# Patient Record
Sex: Female | Born: 1950 | Race: Black or African American | Hispanic: No | Marital: Single | Smoking: Current every day smoker
Health system: Southern US, Community
[De-identification: ages and names within clinical notes are randomized; demographics above are authoritative.]

## PROBLEM LIST (undated history)

## (undated) DIAGNOSIS — T7840XA Allergy, unspecified, initial encounter: Secondary | ICD-10-CM

## (undated) DIAGNOSIS — I639 Cerebral infarction, unspecified: Secondary | ICD-10-CM

## (undated) DIAGNOSIS — M797 Fibromyalgia: Secondary | ICD-10-CM

## (undated) DIAGNOSIS — M810 Age-related osteoporosis without current pathological fracture: Secondary | ICD-10-CM

## (undated) DIAGNOSIS — G4730 Sleep apnea, unspecified: Secondary | ICD-10-CM

## (undated) DIAGNOSIS — F1910 Other psychoactive substance abuse, uncomplicated: Secondary | ICD-10-CM

## (undated) DIAGNOSIS — Z8709 Personal history of other diseases of the respiratory system: Secondary | ICD-10-CM

## (undated) DIAGNOSIS — K219 Gastro-esophageal reflux disease without esophagitis: Secondary | ICD-10-CM

## (undated) DIAGNOSIS — G709 Myoneural disorder, unspecified: Secondary | ICD-10-CM

## (undated) DIAGNOSIS — M1990 Unspecified osteoarthritis, unspecified site: Secondary | ICD-10-CM

## (undated) DIAGNOSIS — F419 Anxiety disorder, unspecified: Secondary | ICD-10-CM

## (undated) DIAGNOSIS — R569 Unspecified convulsions: Secondary | ICD-10-CM

## (undated) DIAGNOSIS — I10 Essential (primary) hypertension: Secondary | ICD-10-CM

## (undated) DIAGNOSIS — F329 Major depressive disorder, single episode, unspecified: Secondary | ICD-10-CM

## (undated) DIAGNOSIS — F32A Depression, unspecified: Secondary | ICD-10-CM

## (undated) HISTORY — DX: Personal history of other diseases of the respiratory system: Z87.09

## (undated) HISTORY — PX: COLON SURGERY: SHX602

## (undated) HISTORY — PX: WISDOM TOOTH EXTRACTION: SHX21

## (undated) HISTORY — DX: Unspecified osteoarthritis, unspecified site: M19.90

## (undated) HISTORY — DX: Gastro-esophageal reflux disease without esophagitis: K21.9

## (undated) HISTORY — PX: CHOLECYSTECTOMY: SHX55

## (undated) HISTORY — DX: Myoneural disorder, unspecified: G70.9

## (undated) HISTORY — DX: Depression, unspecified: F32.A

## (undated) HISTORY — PX: BREAST LUMPECTOMY: SHX2

## (undated) HISTORY — DX: Anxiety disorder, unspecified: F41.9

## (undated) HISTORY — DX: Unspecified convulsions: R56.9

## (undated) HISTORY — PX: TONSILLECTOMY: SUR1361

## (undated) HISTORY — DX: Allergy, unspecified, initial encounter: T78.40XA

## (undated) HISTORY — DX: Sleep apnea, unspecified: G47.30

## (undated) HISTORY — DX: Cerebral infarction, unspecified: I63.9

## (undated) HISTORY — DX: Other psychoactive substance abuse, uncomplicated: F19.10

## (undated) HISTORY — DX: Major depressive disorder, single episode, unspecified: F32.9

---

## 2010-01-10 ENCOUNTER — Emergency Department (HOSPITAL_COMMUNITY)
Admission: EM | Admit: 2010-01-10 | Discharge: 2010-01-11 | Payer: Self-pay | Source: Home / Self Care | Admitting: Emergency Medicine

## 2010-01-10 DIAGNOSIS — F19921 Other psychoactive substance use, unspecified with intoxication with delirium: Secondary | ICD-10-CM

## 2010-03-20 LAB — CBC
MCV: 88.6 fL (ref 78.0–100.0)
Platelets: 402 10*3/uL — ABNORMAL HIGH (ref 150–400)
RDW: 13.4 % (ref 11.5–15.5)
WBC: 13.6 10*3/uL — ABNORMAL HIGH (ref 4.0–10.5)

## 2010-03-20 LAB — RAPID URINE DRUG SCREEN, HOSP PERFORMED: Barbiturates: NOT DETECTED

## 2010-03-20 LAB — URINALYSIS, ROUTINE W REFLEX MICROSCOPIC
Glucose, UA: NEGATIVE mg/dL
Specific Gravity, Urine: 1.024 (ref 1.005–1.030)
pH: 6.5 (ref 5.0–8.0)

## 2010-03-20 LAB — COMPREHENSIVE METABOLIC PANEL
Albumin: 4 g/dL (ref 3.5–5.2)
Alkaline Phosphatase: 100 U/L (ref 39–117)
BUN: 13 mg/dL (ref 6–23)
Calcium: 9.8 mg/dL (ref 8.4–10.5)
Potassium: 3.2 mEq/L — ABNORMAL LOW (ref 3.5–5.1)
Total Protein: 7.7 g/dL (ref 6.0–8.3)

## 2010-03-20 LAB — ETHANOL: Alcohol, Ethyl (B): <5 mg/dL (ref 0–10)

## 2010-03-20 LAB — DIFFERENTIAL
Lymphocytes Relative: 9 % — ABNORMAL LOW (ref 12–46)
Lymphs Abs: 1.3 10*3/uL (ref 0.7–4.0)
Monocytes Absolute: 0.9 10*3/uL (ref 0.1–1.0)
Monocytes Relative: 7 % (ref 3–12)
Neutro Abs: 11.4 10*3/uL — ABNORMAL HIGH (ref 1.7–7.7)

## 2010-03-20 LAB — URINE MICROSCOPIC-ADD ON

## 2013-01-08 DIAGNOSIS — I639 Cerebral infarction, unspecified: Secondary | ICD-10-CM

## 2013-01-08 HISTORY — DX: Cerebral infarction, unspecified: I63.9

## 2013-04-04 ENCOUNTER — Emergency Department (INDEPENDENT_AMBULATORY_CARE_PROVIDER_SITE_OTHER)
Admission: EM | Admit: 2013-04-04 | Discharge: 2013-04-04 | Disposition: A | Discharge status: Home / Self Care | Payer: Medicare Other | Source: Home / Self Care | Attending: Emergency Medicine | Admitting: Emergency Medicine

## 2013-04-04 ENCOUNTER — Encounter (HOSPITAL_COMMUNITY): Payer: Self-pay | Admitting: Emergency Medicine

## 2013-04-04 DIAGNOSIS — S161XXA Strain of muscle, fascia and tendon at neck level, initial encounter: Secondary | ICD-10-CM

## 2013-04-04 DIAGNOSIS — S139XXA Sprain of joints and ligaments of unspecified parts of neck, initial encounter: Secondary | ICD-10-CM

## 2013-04-04 DIAGNOSIS — S39012A Strain of muscle, fascia and tendon of lower back, initial encounter: Secondary | ICD-10-CM

## 2013-04-04 HISTORY — DX: Essential (primary) hypertension: I10

## 2013-04-04 HISTORY — DX: Age-related osteoporosis without current pathological fracture: M81.0

## 2013-04-04 HISTORY — DX: Fibromyalgia: M79.7

## 2013-04-04 MED ORDER — CYCLOBENZAPRINE HCL 5 MG PO TABS: 5.0000 mg | ORAL_TABLET | Freq: Three times a day (TID) | ORAL | Status: DC | PRN

## 2013-04-04 MED ORDER — MELOXICAM 15 MG PO TABS: 15.0000 mg | ORAL_TABLET | Freq: Every day | ORAL | Status: DC

## 2013-04-04 NOTE — Discharge Instructions (Signed)
Do exercises twice daily followed by moist heat for 15 minutes. ° ° ° ° ° °Try to be as active as possible. ° °If no better in 2 weeks, follow up with orthopedist. ° ° °TREATMENT  °Treatment initially involves the use of ice and medication to help reduce pain and inflammation. It is also important to perform strengthening and stretching exercises and modify activities that worsen symptoms so the injury does not get worse. These exercises may be performed at home or with a therapist. For patients who experience severe symptoms, a soft padded collar may be recommended to be worn around the neck.  °Improving your posture may help reduce symptoms. Posture improvement includes pulling your chin and abdomen in while sitting or standing. If you are sitting, sit in a firm chair with your buttocks against the back of the chair. While sleeping, try replacing your pillow with a small towel rolled to 2 inches in diameter, or use a cervical pillow. Poor sleeping positions delay healing.  ° °MEDICATION  °· If pain medication is necessary, nonsteroidal anti-inflammatory medications, such as aspirin and ibuprofen, or other minor pain relievers, such as acetaminophen, are often recommended. °· Do not take pain medication for 7 days before surgery. °· Prescription pain relievers may be given if deemed necessary by your caregiver. Use only as directed and only as much as you need. ° °HEAT AND COLD:  °· Cold treatment (icing) relieves pain and reduces inflammation. Cold treatment should be applied for 10 to 15 minutes every 2 to 3 hours for inflammation and pain and immediately after any activity that aggravates your symptoms. Use ice packs or an ice massage. °· Heat treatment may be used prior to performing the stretching and strengthening activities prescribed by your caregiver, physical therapist, or athletic trainer. Use a heat pack or a warm soak. ° °SEEK MEDICAL CARE IF:  °· Symptoms get worse or do not improve in 2 weeks despite  treatment. °· New, unexplained symptoms develop (drugs used in treatment may produce side effects). ° °EXERCISES °RANGE OF MOTION (ROM) AND STRETCHING EXERCISES - Cervical Strain and Sprain °These exercises may help you when beginning to rehabilitate your injury. In order to successfully resolve your symptoms, you must improve your posture. These exercises are designed to help reduce the forward-head and rounded-shoulder posture which contributes to this condition. Your symptoms may resolve with or without further involvement from your physician, physical therapist or athletic trainer. While completing these exercises, remember:  °· Restoring tissue flexibility helps normal motion to return to the joints. This allows healthier, less painful movement and activity. °· An effective stretch should be held for at least 20 seconds, although you may need to begin with shorter hold times for comfort. °· A stretch should never be painful. You should only feel a gentle lengthening or release in the stretched tissue. ° °STRETCH- Axial Extensors °· Lie on your back on the floor. You may bend your knees for comfort. Place a rolled up hand towel or dish towel, about 2 inches in diameter, under the part of your head that makes contact with the floor. °· Gently tuck your chin, as if trying to make a "double chin," until you feel a gentle stretch at the base of your head. °· Hold _____10_____ seconds. °Repeat _____10_____ times. Complete this exercise _____2_____ times per day.  ° °STRETECH - Axial Extension  °· Stand or sit on a firm surface. Assume a good posture: chest up, shoulders drawn back, abdominal muscles slightly   tense, knees unlocked (if standing) and feet hip width apart. °· Slowly retract your chin so your head slides back and your chin slightly lowers.Continue to look straight ahead. °· You should feel a gentle stretch in the back of your head. Be certain not to feel an aggressive stretch since this can cause  headaches later. °· Hold for ____10______ seconds. °Repeat _____10_____ times. Complete this exercise ____2______ times per day. ° °STRETCH  Cervical Side Bend  °· Stand or sit on a firm surface. Assume a good posture: chest up, shoulders drawn back, abdominal muscles slightly tense, knees unlocked (if standing) and feet hip width apart. °· Without letting your nose or shoulders move, slowly tip your right / left ear to your shoulder until your feel a gentle stretch in the muscles on the opposite side of your neck. °· Hold _____10_____ seconds. °Repeat _____10_____ times. Complete this exercise _____2_____ times per day. ° °STRETCH  Cervical Rotators  °· Stand or sit on a firm surface. Assume a good posture: chest up, shoulders drawn back, abdominal muscles slightly tense, knees unlocked (if standing) and feet hip width apart. °· Keeping your eyes level with the ground, slowly turn your head until you feel a gentle stretch along the back and opposite side of your neck. °· Hold _____10_____ seconds. °Repeat ____10______ times. Complete this exercise ____2______ times per day. ° °RANGE OF MOTION - Neck Circles  °· Stand or sit on a firm surface. Assume a good posture: chest up, shoulders drawn back, abdominal muscles slightly tense, knees unlocked (if standing) and feet hip width apart. °· Gently roll your head down and around from the back of one shoulder to the back of the other. The motion should never be forced or painful. °· Repeat the motion 10-20 times, or until you feel the neck muscles relax and loosen. °Repeat ____10______ times. Complete the exercise _____2_____ times per day. ° °STRENGTHENING EXERCISES - Cervical Strain and Sprain °These exercises may help you when beginning to rehabilitate your injury. They may resolve your symptoms with or without further involvement from your physician, physical therapist or athletic trainer. While completing these exercises, remember:  °· Muscles can gain both the  endurance and the strength needed for everyday activities through controlled exercises. °· Complete these exercises as instructed by your physician, physical therapist or athletic trainer. Progress the resistance and repetitions only as guided. °· You may experience muscle soreness or fatigue, but the pain or discomfort you are trying to eliminate should never worsen during these exercises. If this pain does worsen, stop and make certain you are following the directions exactly. If the pain is still present after adjustments, discontinue the exercise until you can discuss the trouble with your clinician. ° °STRENGTH Cervical Flexors, Isometric °· Face a wall, standing about 6 inches away. Place a small pillow, a ball about 6-8 inches in diameter, or a folded towel between your forehead and the wall. °· Slightly tuck your chin and gently push your forehead into the soft object. Push only with mild to moderate intensity, building up tension gradually. Keep your jaw and forehead relaxed. °· Hold 10 to 20 seconds. Keep your breathing relaxed. °· Release the tension slowly. Relax your neck muscles completely before you start the next repetition. °Repeat _____10_____ times. Complete this exercise _____2_____ times per day. ° °STRENGTH- Cervical Lateral Flexors, Isometric  °· Stand about 6 inches away from a wall. Place a small pillow, a ball about 6-8 inches in diameter, or a folded   towel between the side of your head and the wall. °· Slightly tuck your chin and gently tilt your head into the soft object. Push only with mild to moderate intensity, building up tension gradually. Keep your jaw and forehead relaxed. °· Hold 10 to 20 seconds. Keep your breathing relaxed. °· Release the tension slowly. Relax your neck muscles completely before you start the next repetition. °Repeat _____10_____ times. Complete this exercise ____2______ times per day. ° °STRENGTH  Cervical Extensors, Isometric  °· Stand about 6 inches away from  a wall. Place a small pillow, a ball about 6-8 inches in diameter, or a folded towel between the back of your head and the wall. °· Slightly tuck your chin and gently tilt your head back into the soft object. Push only with mild to moderate intensity, building up tension gradually. Keep your jaw and forehead relaxed. °· Hold 10 to 20 seconds. Keep your breathing relaxed. °· Release the tension slowly. Relax your neck muscles completely before you start the next repetition. °Repeat _____10_____ times. Complete this exercise _____2_____ times per day. ° °POSTURE AND BODY MECHANICS CONSIDERATIONS - Cervical Strain and Sprain °Keeping correct posture when sitting, standing or completing your activities will reduce the stress put on different body tissues, allowing injured tissues a chance to heal and limiting painful experiences. The following are general guidelines for improved posture. Your physician or physical therapist will provide you with any instructions specific to your needs. While reading these guidelines, remember: °· The exercises prescribed by your provider will help you have the flexibility and strength to maintain correct postures. °· The correct posture provides the optimal environment for your joints to work. All of your joints have less wear and tear when properly supported by a spine with good posture. This means you will experience a healthier, less painful body. °· Correct posture must be practiced with all of your activities, especially prolonged sitting and standing. Correct posture is as important when doing repetitive low-stress activities (typing) as it is when doing a single heavy-load activity (lifting). °PROLONGED STANDING WHILE SLIGHTLY LEANING FORWARD °When completing a task that requires you to lean forward while standing in one place for a long time, place either foot up on a stationary 2-4 inch high object to help maintain the best posture. When both feet are on the ground, the low  back tends to lose its slight inward curve. If this curve flattens (or becomes too large), then the back and your other joints will experience too much stress, fatigue more quickly and can cause pain.  °RESTING POSITIONS °Consider which positions are most painful for you when choosing a resting position. If you have pain with flexion-based activities (sitting, bending, stooping, squatting), choose a position that allows you to rest in a less flexed posture. You would want to avoid curling into a fetal position on your side. If your pain worsens with extension-based activities (prolonged standing, working overhead), avoid resting in an extended position such as sleeping on your stomach. Most people will find more comfort when they rest with their spine in a more neutral position, neither too rounded nor too arched. Lying on a non-sagging bed on your side with a pillow between your knees, or on your back with a pillow under your knees will often provide some relief. Keep in mind, being in any one position for a prolonged period of time, no matter how correct your posture, can still lead to stiffness. °WALKING °Walk with an upright posture. Your ears,   shoulders and hips should all line-up. °OFFICE WORK °When working at a desk, create an environment that supports good, upright posture. Without extra support, muscles fatigue and lead to excessive strain on joints and other tissues. °CHAIR: °· A chair should be able to slide under your desk when your back makes contact with the back of the chair. This allows you to work closely. °· The chair's height should allow your eyes to be level with the upper part of your monitor and your hands to be slightly lower than your elbows. °· Body position: °· Your feet should make contact with the floor. If this is not possible, use a foot rest. °· Keep your ears over your shoulders. This will reduce stress on your neck and low back. °Document Released: 12/25/2004 Document Revised:  03/19/2011 Document Reviewed: 04/08/2008 °[employer]® Patient Information ©2013 [employer]. ° ° °
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
## 2013-07-21 ENCOUNTER — Ambulatory Visit: Payer: Medicare Other | Admitting: Diagnostic Neuroimaging

## 2013-08-03 ENCOUNTER — Ambulatory Visit (INDEPENDENT_AMBULATORY_CARE_PROVIDER_SITE_OTHER): Payer: BC Managed Care – PPO | Admitting: Diagnostic Neuroimaging

## 2013-08-03 ENCOUNTER — Encounter: Payer: Self-pay | Admitting: Diagnostic Neuroimaging

## 2013-08-03 VITALS — BP 116/76 | HR 114 | Temp 98.7°F | Ht 62.0 in | Wt 171.0 lb

## 2013-08-03 DIAGNOSIS — R259 Unspecified abnormal involuntary movements: Secondary | ICD-10-CM

## 2013-08-03 DIAGNOSIS — R251 Tremor, unspecified: Secondary | ICD-10-CM

## 2013-08-03 NOTE — Progress Notes (Signed)
[facility]  PATIENT: [name] [name] DOB: Mar 29, 1950  REFERRING CLINICIAN: Kpeglio HISTORY FROM: patient  REASON FOR VISIT: new consult   HISTORICAL  CHIEF COMPLAINT:  Chief Complaint  Patient presents with  . Tremors    Extremeties - started in hands; in legs now also.    HISTORY OF PRESENT ILLNESS:   63 year old right-handed female here for evaluation of tremor.  For past 5 years patient has had gradual onset, progressive, intermittent tremor in her hands. Patient notices tremor when her hands are hurting from fibromyalgia or when she is under increased stress, anxiety, panic sensation. Patient has been on Risperdal for 4 years. Patient reports being on some medications to help with tremor control in the past but doesn't remember the names. Patient sees a PCP as well as psychiatrist.  No gait or balance difficulty. She has had some weight gain and fatigue. Some memory loss, headache, weakness, insomnia, snoring, restless legs. Patient reports remote history of seizure.  REVIEW OF SYSTEMS: Full 14 system review of systems performed and notable only for only as per history of present illness.  ALLERGIES: Allergies  Allergen Reactions  . Tetracyclines & Related     HOME MEDICATIONS: Outpatient Prescriptions Prior to Visit  Medication Sig Dispense Refill  . ALPRAZolam (XANAX) 0.5 MG tablet Take 0.5 mg by mouth at bedtime as needed for anxiety.      Marland Kitchen CITALOPRAM HYDROBROMIDE PO Take by mouth.      . cyclobenzaprine (FLEXERIL) 5 MG tablet Take 1 tablet (5 mg total) by mouth 3 (three) times daily as needed for muscle spasms.  30 tablet  0  . meloxicam (MOBIC) 15 MG tablet Take 1 tablet (15 mg total) by mouth daily.  15 tablet  0  . oxyCODONE (ROXICODONE) 15 MG immediate release tablet Take 15 mg by mouth every 4 (four) hours as needed for pain.      Marland Kitchen risperiDONE (RISPERDAL) 2 MG tablet Take 2 mg by mouth at bedtime.       No facility-administered  medications prior to visit.    PAST MEDICAL HISTORY: Past Medical History  Diagnosis Date  . Hypertension   . Fibromyalgia   . Osteoporosis     PAST SURGICAL HISTORY: Past Surgical History  Procedure Laterality Date  . Cholecystectomy      FAMILY HISTORY: Family History  Problem Relation Age of Onset  . Stroke Father   . Alzheimer's disease Mother     SOCIAL HISTORY:  History   Social History  . Marital Status: Single    Spouse Name: N/A    Number of Children: N/A  . Years of Education: N/A   Occupational History  . Not on file.   Social History Main Topics  . Smoking status: Current Every Day Smoker -- 0.50 packs/day    Types: Cigarettes  . Smokeless tobacco: Not on file  . Alcohol Use: Yes  . Drug Use: No  . Sexual Activity: Not on file   Other Topics Concern  . Not on file   Social History Narrative  . No narrative on file     PHYSICAL EXAM  Filed Vitals:   08/03/13 1019  BP: 116/76  Pulse: 114  Temp: 98.7 F (37.1 C)  TempSrc: Oral  Height: '5\' 2"'  (1.575 m)  Weight: 171 lb (77.565 kg)    Not recorded    Body mass index is 31.27 kg/(m^2).  GENERAL EXAM: Patient is in no distress; well developed, nourished and groomed; neck is  supple  CARDIOVASCULAR: Regular rate and rhythm, no murmurs, no carotid bruits  NEUROLOGIC: MENTAL STATUS: awake, alert, oriented to person, place and time, recent and remote memory intact, normal attention and concentration, language fluent, comprehension intact, naming intact, fund of knowledge appropriate; MASKED FACIES. NEG SNOUT. POSITIVE MYERSONS. CRANIAL NERVE: no papilledema on fundoscopic exam, pupils equal and reactive to light, visual fields full to confrontation, extraocular muscles intact, no nystagmus, facial sensation and strength symmetric, hearing intact, palate elevates symmetrically, uvula midline, shoulder shrug symmetric, tongue midline. MOTOR: normal bulk and tone, MINIMAL POSTURAL TREMOR IN  HANDS. NO REST TREMOR. NO ACTION TREMOR. NO RIGIDITY OR BRADYKINESIA. SMOOTH ARCHIMEDES SPIRALS. Full strength in the BUE, BLE SENSORY: normal and symmetric to light touch, pinprick, temperature, vibration COORDINATION: finger-nose-finger, fine finger movements normal REFLEXES: deep tendon reflexes present and symmetric GAIT/STATION: narrow based gait; able to walk on toes, heels and tandem; romberg is negative    DIAGNOSTIC DATA (LABS, IMAGING, TESTING) - I reviewed patient records, labs, notes, testing and imaging myself where available.  Lab Results  Component Value Date   WBC 13.6* 01/10/2010   HGB 13.6 01/10/2010   HCT 39.0 01/10/2010   MCV 88.6 01/10/2010   PLT 402* 01/10/2010      Component Value Date/Time   NA 142 01/10/2010 1125   K 3.2* 01/10/2010 1125   CL 104 01/10/2010 1125   CO2 27 01/10/2010 1125   GLUCOSE 128* 01/10/2010 1125   BUN 13 01/10/2010 1125   CREATININE 1.09 01/10/2010 1125   CALCIUM 9.8 01/10/2010 1125   PROT 7.7 01/10/2010 1125   ALBUMIN 4.0 01/10/2010 1125   AST 23 01/10/2010 1125   ALT 11 01/10/2010 1125   ALKPHOS 100 01/10/2010 1125   BILITOT 0.8 01/10/2010 1125   GFRNONAA 51* 01/10/2010 1125   GFRAA  Value: >60        The eGFR has been calculated using the MDRD equation. This calculation has not been validated in all clinical situations. eGFR's persistently <60 mL/min signify possible Chronic Kidney Disease. 01/10/2010 1125   No results found for this basename: CHOL, HDL, LDLCALC, LDLDIRECT, TRIG, CHOLHDL   No results found for this basename: HGBA1C   No results found for this basename: VITAMINB12   No results found for this basename: TSH     ASSESSMENT AND PLAN  63 y.o. year old female here with tremor. Likely enhanced physiologic tremor related to pain and stress/anxiety. No signs of parkinson's disease. Some drug induced parkinsonism is possible with risperidone.   PLAN: - tremor workup - consider lower dose of risperidone; but patient reluctant to try because it is  helping her other symptoms  Orders Placed This Encounter  Procedures  . MR Brain W Wo Contrast  . TSH  . Comprehensive metabolic panel   Return in about 6 months (around 02/03/2014).    Penni Bombard, MD 2/44/6950, 72:25 AM Certified in Neurology, Neurophysiology and Neuroimaging  [facility] [address] [place], [place] [address] [phone]

## 2013-08-04 LAB — COMPREHENSIVE METABOLIC PANEL
ALK PHOS: 127 IU/L — AB (ref 39–117)
ALT: 16 IU/L (ref 0–32)
AST: 17 IU/L (ref 0–40)
Albumin/Globulin Ratio: 1.8 (ref 1.1–2.5)
Albumin: 4.3 g/dL (ref 3.6–4.8)
BUN / CREAT RATIO: 11 (ref 11–26)
BUN: 11 mg/dL (ref 8–27)
CO2: 24 mmol/L (ref 18–29)
CREATININE: 0.97 mg/dL (ref 0.57–1.00)
Calcium: 9.8 mg/dL (ref 8.7–10.3)
Chloride: 90 mmol/L — ABNORMAL LOW (ref 97–108)
GFR calc Af Amer: 72 mL/min/{1.73_m2} (ref >59)
GFR calc non Af Amer: 62 mL/min/{1.73_m2} (ref >59)
GLOBULIN, TOTAL: 2.4 g/dL (ref 1.5–4.5)
Glucose: 105 mg/dL — ABNORMAL HIGH (ref 65–99)
POTASSIUM: 3.7 mmol/L (ref 3.5–5.2)
SODIUM: 136 mmol/L (ref 134–144)
Total Bilirubin: 0.3 mg/dL (ref 0.0–1.2)
Total Protein: 6.7 g/dL (ref 6.0–8.5)

## 2013-08-04 LAB — TSH: TSH: 0.509 u[IU]/mL (ref 0.450–4.500)

## 2013-08-15 ENCOUNTER — Other Ambulatory Visit: Payer: Medicare Other

## 2013-08-18 ENCOUNTER — Inpatient Hospital Stay: Admission: RE | Admit: 2013-08-18 | Payer: Medicare Other | Source: Ambulatory Visit

## 2013-08-19 ENCOUNTER — Observation Stay (HOSPITAL_COMMUNITY)
Admission: EM | Admit: 2013-08-19 | Discharge: 2013-08-22 | Disposition: A | Discharge status: Home / Self Care | Payer: Medicare Other | Attending: Internal Medicine | Admitting: Internal Medicine

## 2013-08-19 ENCOUNTER — Emergency Department (HOSPITAL_COMMUNITY): Payer: Medicare Other

## 2013-08-19 ENCOUNTER — Encounter (HOSPITAL_COMMUNITY): Payer: Self-pay | Admitting: Emergency Medicine

## 2013-08-19 DIAGNOSIS — R569 Unspecified convulsions: Secondary | ICD-10-CM | POA: Diagnosis present

## 2013-08-19 DIAGNOSIS — R130 Aphagia: Secondary | ICD-10-CM

## 2013-08-19 DIAGNOSIS — Z79899 Other long term (current) drug therapy: Secondary | ICD-10-CM | POA: Insufficient documentation

## 2013-08-19 DIAGNOSIS — F329 Major depressive disorder, single episode, unspecified: Secondary | ICD-10-CM | POA: Diagnosis not present

## 2013-08-19 DIAGNOSIS — IMO0001 Reserved for inherently not codable concepts without codable children: Secondary | ICD-10-CM | POA: Diagnosis not present

## 2013-08-19 DIAGNOSIS — I69920 Aphasia following unspecified cerebrovascular disease: Secondary | ICD-10-CM

## 2013-08-19 DIAGNOSIS — G20 Parkinson's disease: Secondary | ICD-10-CM | POA: Insufficient documentation

## 2013-08-19 DIAGNOSIS — I6350 Cerebral infarction due to unspecified occlusion or stenosis of unspecified cerebral artery: Secondary | ICD-10-CM

## 2013-08-19 DIAGNOSIS — M810 Age-related osteoporosis without current pathological fracture: Secondary | ICD-10-CM | POA: Insufficient documentation

## 2013-08-19 DIAGNOSIS — F1210 Cannabis abuse, uncomplicated: Secondary | ICD-10-CM | POA: Diagnosis not present

## 2013-08-19 DIAGNOSIS — F17200 Nicotine dependence, unspecified, uncomplicated: Secondary | ICD-10-CM | POA: Diagnosis not present

## 2013-08-19 DIAGNOSIS — F411 Generalized anxiety disorder: Secondary | ICD-10-CM | POA: Diagnosis not present

## 2013-08-19 DIAGNOSIS — IMO0002 Reserved for concepts with insufficient information to code with codable children: Secondary | ICD-10-CM

## 2013-08-19 DIAGNOSIS — R4182 Altered mental status, unspecified: Secondary | ICD-10-CM

## 2013-08-19 DIAGNOSIS — F3289 Other specified depressive episodes: Secondary | ICD-10-CM | POA: Insufficient documentation

## 2013-08-19 DIAGNOSIS — I10 Essential (primary) hypertension: Secondary | ICD-10-CM | POA: Diagnosis not present

## 2013-08-19 DIAGNOSIS — R9431 Abnormal electrocardiogram [ECG] [EKG]: Secondary | ICD-10-CM | POA: Diagnosis not present

## 2013-08-19 DIAGNOSIS — I6389 Other cerebral infarction: Secondary | ICD-10-CM

## 2013-08-19 DIAGNOSIS — M797 Fibromyalgia: Secondary | ICD-10-CM | POA: Diagnosis present

## 2013-08-19 DIAGNOSIS — E876 Hypokalemia: Secondary | ICD-10-CM | POA: Insufficient documentation

## 2013-08-19 DIAGNOSIS — R4701 Aphasia: Secondary | ICD-10-CM | POA: Insufficient documentation

## 2013-08-19 DIAGNOSIS — G20A1 Parkinson's disease without dyskinesia, without mention of fluctuations: Secondary | ICD-10-CM | POA: Insufficient documentation

## 2013-08-19 DIAGNOSIS — G9349 Other encephalopathy: Secondary | ICD-10-CM | POA: Diagnosis not present

## 2013-08-19 DIAGNOSIS — F1290 Cannabis use, unspecified, uncomplicated: Secondary | ICD-10-CM | POA: Diagnosis present

## 2013-08-19 LAB — COMPREHENSIVE METABOLIC PANEL
ALK PHOS: 119 U/L — AB (ref 39–117)
ALT: 13 U/L (ref 0–35)
AST: 17 U/L (ref 0–37)
Albumin: 4.2 g/dL (ref 3.5–5.2)
Anion gap: 21 — ABNORMAL HIGH (ref 5–15)
BILIRUBIN TOTAL: 0.5 mg/dL (ref 0.3–1.2)
BUN: 13 mg/dL (ref 6–23)
CHLORIDE: 93 meq/L — AB (ref 96–112)
CO2: 23 meq/L (ref 19–32)
Calcium: 10.2 mg/dL (ref 8.4–10.5)
Creatinine, Ser: 0.99 mg/dL (ref 0.50–1.10)
GFR calc non Af Amer: 59 mL/min — ABNORMAL LOW (ref >90)
GFR, EST AFRICAN AMERICAN: 69 mL/min — AB (ref >90)
GLUCOSE: 142 mg/dL — AB (ref 70–99)
Potassium: 3.4 mEq/L — ABNORMAL LOW (ref 3.7–5.3)
SODIUM: 137 meq/L (ref 137–147)
Total Protein: 8.3 g/dL (ref 6.0–8.3)

## 2013-08-19 LAB — URINALYSIS, ROUTINE W REFLEX MICROSCOPIC
Glucose, UA: NEGATIVE mg/dL
Ketones, ur: >80 mg/dL — AB
Leukocytes, UA: NEGATIVE
Nitrite: NEGATIVE
Protein, ur: 100 mg/dL — AB
Specific Gravity, Urine: 1.021 (ref 1.005–1.030)
Urobilinogen, UA: 1 mg/dL (ref 0.0–1.0)
pH: 6 (ref 5.0–8.0)

## 2013-08-19 LAB — CBG MONITORING, ED: Glucose-Capillary: 147 mg/dL — ABNORMAL HIGH (ref 70–99)

## 2013-08-19 LAB — DIFFERENTIAL
Basophils Absolute: 0 10*3/uL (ref 0.0–0.1)
Basophils Relative: 0 % (ref 0–1)
EOS PCT: 0 % (ref 0–5)
Eosinophils Absolute: 0 10*3/uL (ref 0.0–0.7)
LYMPHS PCT: 15 % (ref 12–46)
Lymphs Abs: 1.5 10*3/uL (ref 0.7–4.0)
Monocytes Absolute: 0.8 10*3/uL (ref 0.1–1.0)
Monocytes Relative: 8 % (ref 3–12)
NEUTROS ABS: 7.7 10*3/uL (ref 1.7–7.7)
Neutrophils Relative %: 77 % (ref 43–77)

## 2013-08-19 LAB — CBC
HCT: 43.6 % (ref 36.0–46.0)
Hemoglobin: 15.8 g/dL — ABNORMAL HIGH (ref 12.0–15.0)
MCH: 29.9 pg (ref 26.0–34.0)
MCHC: 36.2 g/dL — ABNORMAL HIGH (ref 30.0–36.0)
MCV: 82.4 fL (ref 78.0–100.0)
PLATELETS: 406 10*3/uL — AB (ref 150–400)
RBC: 5.29 MIL/uL — AB (ref 3.87–5.11)
RDW: 13.3 % (ref 11.5–15.5)
WBC: 10.1 10*3/uL (ref 4.0–10.5)

## 2013-08-19 LAB — URINE MICROSCOPIC-ADD ON

## 2013-08-19 LAB — RAPID URINE DRUG SCREEN, HOSP PERFORMED
Amphetamines: NOT DETECTED
Barbiturates: NOT DETECTED
Benzodiazepines: NOT DETECTED
Cocaine: NOT DETECTED
Opiates: NOT DETECTED
Tetrahydrocannabinol: POSITIVE — AB

## 2013-08-19 LAB — I-STAT CHEM 8, ED
BUN: 12 mg/dL (ref 6–23)
CHLORIDE: 94 meq/L — AB (ref 96–112)
CREATININE: 1.1 mg/dL (ref 0.50–1.10)
Calcium, Ion: 1.14 mmol/L (ref 1.13–1.30)
Glucose, Bld: 150 mg/dL — ABNORMAL HIGH (ref 70–99)
HCT: 51 % — ABNORMAL HIGH (ref 36.0–46.0)
Hemoglobin: 17.3 g/dL — ABNORMAL HIGH (ref 12.0–15.0)
Potassium: 3.1 mEq/L — ABNORMAL LOW (ref 3.7–5.3)
Sodium: 136 mEq/L — ABNORMAL LOW (ref 137–147)
TCO2: 24 mmol/L (ref 0–100)

## 2013-08-19 LAB — I-STAT TROPONIN, ED: Troponin i, poc: 0 ng/mL (ref 0.00–0.08)

## 2013-08-19 LAB — TROPONIN I: Troponin I: <0.3 ng/mL (ref <0.30)

## 2013-08-19 LAB — CARBOXYHEMOGLOBIN
Carboxyhemoglobin: 1.4 % (ref 0.5–1.5)
METHEMOGLOBIN: 0.8 % (ref 0.0–1.5)
O2 Saturation: 60.9 %
Total hemoglobin: 16.2 g/dL — ABNORMAL HIGH (ref 12.0–16.0)

## 2013-08-19 LAB — PROTIME-INR
INR: 1.05 (ref 0.00–1.49)
PROTHROMBIN TIME: 13.7 s (ref 11.6–15.2)

## 2013-08-19 LAB — APTT: aPTT: 37 seconds (ref 24–37)

## 2013-08-19 MED ORDER — ENOXAPARIN SODIUM 40 MG/0.4ML ~~LOC~~ SOLN
40.0000 mg | SUBCUTANEOUS | Status: DC
  Administered 2013-08-19 – 2013-08-21 (×3): 40 mg via SUBCUTANEOUS
  Filled 2013-08-19 (×3): qty 0.4

## 2013-08-19 MED ORDER — HYDROCHLOROTHIAZIDE 25 MG PO TABS
25.0000 mg | ORAL_TABLET | Freq: Every day | ORAL | Status: DC
  Administered 2013-08-20 – 2013-08-22 (×3): 25 mg via ORAL
  Filled 2013-08-19 (×3): qty 1

## 2013-08-19 MED ORDER — POTASSIUM CHLORIDE 10 MEQ/100ML IV SOLN
10.0000 meq | INTRAVENOUS | Status: AC
  Administered 2013-08-19: 10 meq via INTRAVENOUS
  Filled 2013-08-19 (×2): qty 100

## 2013-08-19 MED ORDER — POTASSIUM CHLORIDE 10 MEQ/100ML IV SOLN
10.0000 meq | Freq: Once | INTRAVENOUS | Status: AC
  Administered 2013-08-19: 10 meq via INTRAVENOUS

## 2013-08-19 MED ORDER — ALPRAZOLAM 0.5 MG PO TABS
0.5000 mg | ORAL_TABLET | Freq: Every day | ORAL | Status: DC
  Administered 2013-08-20 – 2013-08-22 (×3): 0.5 mg via ORAL
  Filled 2013-08-19 (×3): qty 1

## 2013-08-19 MED ORDER — PANTOPRAZOLE SODIUM 40 MG PO TBEC
40.0000 mg | DELAYED_RELEASE_TABLET | Freq: Every day | ORAL | Status: DC
  Administered 2013-08-20 – 2013-08-22 (×3): 40 mg via ORAL
  Filled 2013-08-19 (×3): qty 1

## 2013-08-19 MED ORDER — CITALOPRAM HYDROBROMIDE 10 MG PO TABS
10.0000 mg | ORAL_TABLET | Freq: Every day | ORAL | Status: DC
  Administered 2013-08-20 – 2013-08-22 (×3): 10 mg via ORAL
  Filled 2013-08-19 (×3): qty 1

## 2013-08-19 MED ORDER — RISPERIDONE 0.5 MG PO TABS
2.0000 mg | ORAL_TABLET | Freq: Every day | ORAL | Status: DC
  Administered 2013-08-19 – 2013-08-21 (×3): 2 mg via ORAL
  Filled 2013-08-19 (×3): qty 4

## 2013-08-19 MED ORDER — STROKE: EARLY STAGES OF RECOVERY BOOK
Freq: Once | Status: AC
  Administered 2013-08-19: 21:00:00
  Filled 2013-08-19 (×2): qty 1

## 2013-08-19 MED ORDER — SENNOSIDES-DOCUSATE SODIUM 8.6-50 MG PO TABS: 1.0000 | ORAL_TABLET | Freq: Every evening | ORAL | Status: DC | PRN

## 2013-08-19 NOTE — ED Provider Notes (Signed)
CSN: [id number]     Arrival date & time 08/19/13  72 History   First MD Initiated Contact with Patient 08/19/13 1545     Chief Complaint  Patient presents with  . Altered Mental Status   Level 5 Caveat   (Consider location/radiation/quality/duration/timing/severity/associated sxs/prior Treatment) HPI Pt is a 63yo female with hx of HTH, fibromyalgia, and osteoporosis brought to ED from home via EMS with reports of AMS.  Per triage note from EMS, pt lives alone, a friend picked her up yesterday and noticed she was acting "differently."  The friend returned to check on pt today and noted a smell of gas in the home, pt was holding a tube of toothpaste to her ear and talking about a toothache.  Pt is able to follow commands but is then nonsensical.  Per RN note, when asked where she was, pt responded "in cold bing bong and I stand thing frying thing."  Pt denies pain, is calm and cooperative.   6:06 PM family has arrived and states pt has had similar episodes of AMS in past and believe it may be due to early dementia due to family hx of same. Denies hx of previous strokes.  Pt was scheduled for an MRI of her brain yesterday but did not go due to "not acting right" but AMS worsened today which prompted family to call EMS.  Past Medical History  Diagnosis Date  . Hypertension   . Fibromyalgia   . Osteoporosis    Past Surgical History  Procedure Laterality Date  . Cholecystectomy     Family History  Problem Relation Age of Onset  . Stroke Father   . Alzheimer's disease Mother    History  Substance Use Topics  . Smoking status: Current Every Day Smoker -- 0.50 packs/day    Types: Cigarettes  . Smokeless tobacco: Not on file  . Alcohol Use: Yes   OB History   Grav Para Term Preterm Abortions TAB SAB Ect Mult Living                 Review of Systems  Unable to perform ROS: Mental status change      Allergies  Tetracyclines & related  Home Medications   Prior to Admission  medications   Medication Sig Start Date End Date Taking? Authorizing Provider  ALPRAZolam Duanne Moron) 0.5 MG tablet Take 0.5 mg by mouth daily.    Yes Historical Provider, MD  CITALOPRAM HYDROBROMIDE PO Take by mouth.    Historical Provider, MD  cyclobenzaprine (FLEXERIL) 5 MG tablet Take 1 tablet (5 mg total) by mouth 3 (three) times daily as needed for muscle spasms. 04/04/13   Harden Mo, MD  hydrochlorothiazide (HYDRODIURIL) 25 MG tablet Take 1 tablet by mouth daily. 07/02/13   Historical Provider, MD  hydrOXYzine (ATARAX/VISTARIL) 25 MG tablet Take 1 tablet by mouth daily. 07/09/13   Historical Provider, MD  meloxicam (MOBIC) 15 MG tablet Take 1 tablet (15 mg total) by mouth daily. 04/04/13   Harden Mo, MD  omeprazole (PRILOSEC) 20 MG capsule Take 1 capsule by mouth daily. 07/29/13   Historical Provider, MD  oxyCODONE (ROXICODONE) 15 MG immediate release tablet Take 15 mg by mouth every 4 (four) hours as needed for pain.    Historical Provider, MD  prochlorperazine (COMPAZINE) 10 MG tablet Take 1 tablet by mouth as needed. 07/03/13   Historical Provider, MD  risperiDONE (RISPERDAL) 2 MG tablet Take 2 mg by mouth at bedtime.    Historical  Provider, MD  triamterene-hydrochlorothiazide (MAXZIDE-25) 37.5-25 MG per tablet Take 1 tablet by mouth daily. 07/03/13   Historical Provider, MD   BP 122/70  Pulse 112  Temp(Src) 99.1 F (37.3 C) (Rectal)  Resp 21  SpO2 95% Physical Exam  Nursing note and vitals reviewed. Constitutional: She appears well-developed and well-nourished. No distress.  Elderly female lying in exam bed, awake and alert. Non-toxic appearing, NAD  HENT:  Head: Normocephalic and atraumatic.  Eyes: Conjunctivae are normal. No scleral icterus.  Neck: Normal range of motion.  Cardiovascular: Regular rhythm and normal heart sounds.  Tachycardia present.   Pulmonary/Chest: Effort normal and breath sounds normal. No respiratory distress. She has no wheezes. She has no rales. She  exhibits no tenderness.  No respiratory distress. Decreased lung sounds in lower lung fields.  No wheeze or crackles  Abdominal: Soft. Bowel sounds are normal. She exhibits no distension and no mass. There is no tenderness. There is no rebound and no guarding.  Musculoskeletal: Normal range of motion.  Neurological: She is alert.  Pt awake and alert, able to follow most commands. 4/5 strength in upper and lower extremities bilaterally. occasionally answers "yes" and "no" to questions.  Nonsensical speech at times.  Pt not ambulated due to AMS  Skin: Skin is warm and dry. She is not diaphoretic.    ED Course  Procedures (including critical care time) Labs Review Labs Reviewed  CBC - Abnormal; Notable for the following:    RBC 5.29 (*)    Hemoglobin 15.8 (*)    MCHC 36.2 (*)    Platelets 406 (*)    All other components within normal limits  COMPREHENSIVE METABOLIC PANEL - Abnormal; Notable for the following:    Potassium 3.4 (*)    Chloride 93 (*)    Glucose, Bld 142 (*)    Alkaline Phosphatase 119 (*)    GFR calc non Af Amer 59 (*)    GFR calc Af Amer 69 (*)    Anion gap 21 (*)    All other components within normal limits  CARBOXYHEMOGLOBIN - Abnormal; Notable for the following:    Total hemoglobin 16.2 (*)    All other components within normal limits  URINE RAPID DRUG SCREEN (HOSP PERFORMED) - Abnormal; Notable for the following:    Tetrahydrocannabinol POSITIVE (*)    All other components within normal limits  URINALYSIS, ROUTINE W REFLEX MICROSCOPIC - Abnormal; Notable for the following:    Hgb urine dipstick MODERATE (*)    Bilirubin Urine MODERATE (*)    Ketones, ur >80 (*)    Protein, ur 100 (*)    All other components within normal limits  URINE MICROSCOPIC-ADD ON - Abnormal; Notable for the following:    Casts HYALINE CASTS (*)    All other components within normal limits  CBG MONITORING, ED - Abnormal; Notable for the following:    Glucose-Capillary 147 (*)     All other components within normal limits  I-STAT CHEM 8, ED - Abnormal; Notable for the following:    Sodium 136 (*)    Potassium 3.1 (*)    Chloride 94 (*)    Glucose, Bld 150 (*)    Hemoglobin 17.3 (*)    HCT 51.0 (*)    All other components within normal limits  PROTIME-INR  APTT  DIFFERENTIAL  Randolm Idol, ED    Imaging Review Dg Chest 2 View  08/19/2013   CLINICAL DATA:  Chest pain  EXAM: CHEST  2 VIEW  COMPARISON:  None.  FINDINGS: The cardiac shadow is within normal limits. The lungs are clear bilaterally. A calcified granuloma is noted in the right upper lung. No sizable infiltrate or effusion is seen. No bony abnormality is noted.  IMPRESSION: No active cardiopulmonary disease.   Electronically Signed   By: Inez Catalina M.D.   On: 08/19/2013 17:27   Ct Head (brain) Wo Contrast  08/19/2013   CLINICAL DATA:  Altered mental status.  Difficulty with speech.  EXAM: CT HEAD WITHOUT CONTRAST  TECHNIQUE: Contiguous axial images were obtained from the base of the skull through the vertex without intravenous contrast.  COMPARISON:  01/10/2010.  FINDINGS: No evidence of an acute infarct, acute hemorrhage, mass lesion, mass effect or hydrocephalus. Visualized portions of the paranasal sinuses and mastoid air cells are clear.  IMPRESSION: No acute findings.   Electronically Signed   By: Lorin Picket M.D.   On: 08/19/2013 17:07     EKG Interpretation   Date/Time:  Wednesday August 19 2013 15:27:01 EDT Ventricular Rate:  113 PR Interval:  142 QRS Duration: 75 QT Interval:  306 QTC Calculation: 419 R Axis:   13 Text Interpretation:  Sinus tachycardia Consider left atrial enlargement  Low voltage, precordial leads Abnormal T, consider ischemia, diffuse leads  no previous for comparison Confirmed by HARRISON  MD, FORREST ([id number]) on  08/19/2013 4:05:37 PM      MDM   Final diagnoses:  Altered mental status, unspecified altered mental status type  Aphagia    Pt is a 63yo  female presenting from home with AMS, last seen normal, unknown. Per hx, sounds like pt has had AMS for at least 24 hours.  Reports of possible gas smell in the home.  Will get labs and imaging to look for cause of AMS.  DDx: CVA, intracranial bleed or mass, infectious process, poisoning, toxin, overdose.    Labs reviewed, significant for: urine drug: positive for tetrahydrocannabinol.  UA: moderate Hgb but no evidence of UTI.   Carboxyhemoglobin: Total Hgb: mildly elevated to 16.2, O2- WNL. Istat: hypokalemia: 3.1. Pt given 48mEq IV potassium, CMP: 3.4. CT head: no acute findings,  6:06 PM family has arrived and states pt has had similar episodes of AMS in past and believe it may be due to early dementia due to family hx of same. Denies hx of previous strokes.  Pt was scheduled for an MRI of her brain yesterday but did not go due to "not acting right" but AMS worsened today which prompted family to call EMS.  Discussed pt with Dr. Aline Brochure who also examined pt. Will admit pt for AMS and MR brain while hospitalized. No obvious cause of pt's status at this time.  Will consult with neurology.   6:16 PM Consulted with Dr. Aram Beecham, neurology, who agreed to f/u with pt during hospitalization for AMS, aphagia.    6:34 PM Consulted with Dr. Arnoldo Morale, internal medicine, pt will be admitted to observation tele. Pt is still stable at this time.       Noland Fordyce, PA-C 08/19/13 1836

## 2013-08-19 NOTE — H&P (Signed)
[facility] Admission History and Physical       Hodaya Curto QIW:[id number] DOB: 05-16-1950 DOA: 08/19/2013  Referring physician:  EDP PCP: PROVIDER NOT IN SYSTEM  Specialists:   Chief Complaint:  Confusion and Altered Speech  HPI: [name] [name] is a 63 y.o. female with a history of HTN, and Fibromyalgia who presents to the ED with complaints of confusion and problems with her speech for the past 2 days.  Per her family she was last seen norma 2 days  Ago, and she has been speaking and not making sense, and having inappropriate answers to questions.   She denies having any headache or chest pain.   She was evaluated in the Ed and ahad a CT scan of her Head which was negative for acute findings, and Neurology was consulted.   She was referred for further evaluation.        Review of Systems:  Constitutional: No Weight Loss, No Weight Gain, Night Sweats, Fevers, Chills, Dizziness, Fatigue, or Generalized Weakness HEENT: No Headaches, Difficulty Swallowing,Tooth/Dental Problems,Sore Throat,  No Sneezing, Rhinitis, Ear Ache, Nasal Congestion, or Post Nasal Drip,  Cardio-vascular:  No Chest pain, Orthopnea, PND, Edema in Lower Extremities, Anasarca, Dizziness, Palpitations  Resp: No Dyspnea, No DOE, No Cough, No Hemoptysis, No Wheezing.    GI: No Heartburn, Indigestion, Abdominal Pain, Nausea, Vomiting, Diarrhea, Hematemesis, Hematochezia, Melena, Change in Bowel Habits,  Loss of Appetite  GU: No Dysuria, Change in Color of Urine, No Urgency or Frequency, No Flank pain.  Musculoskeletal: No Joint Pain or Swelling, No Decreased Range of Motion, No Back Pain.  Neurologic: No Syncope, No Seizures, Muscle Weakness, Paresthesia, Vision Disturbance or Loss, No Diplopia, No Vertigo, No Difficulty Walking,  Skin: No Rash or Lesions. Psych: No Change in Mood or Affect, No Depression or Anxiety, No Memory loss, +Confusion, +Expressive Aphasia,  or Hallucinations   Past Medical History    Diagnosis Date  . Hypertension   . Fibromyalgia   . Osteoporosis       Past Surgical History  Procedure Laterality Date  . Cholecystectomy         Prior to Admission medications   Medication Sig Start Date End Date Taking? Authorizing Provider  ALPRAZolam Duanne Moron) 0.5 MG tablet Take 0.5 mg by mouth daily.    Yes Historical Provider, MD  CITALOPRAM HYDROBROMIDE PO Take by mouth.    Historical Provider, MD  cyclobenzaprine (FLEXERIL) 5 MG tablet Take 1 tablet (5 mg total) by mouth 3 (three) times daily as needed for muscle spasms. 04/04/13   Harden Mo, MD  hydrochlorothiazide (HYDRODIURIL) 25 MG tablet Take 1 tablet by mouth daily. 07/02/13   Historical Provider, MD  hydrOXYzine (ATARAX/VISTARIL) 25 MG tablet Take 1 tablet by mouth daily. 07/09/13   Historical Provider, MD  meloxicam (MOBIC) 15 MG tablet Take 1 tablet (15 mg total) by mouth daily. 04/04/13   Harden Mo, MD  omeprazole (PRILOSEC) 20 MG capsule Take 1 capsule by mouth daily. 07/29/13   Historical Provider, MD  oxyCODONE (ROXICODONE) 15 MG immediate release tablet Take 15 mg by mouth every 4 (four) hours as needed for pain.    Historical Provider, MD  prochlorperazine (COMPAZINE) 10 MG tablet Take 1 tablet by mouth as needed. 07/03/13   Historical Provider, MD  risperiDONE (RISPERDAL) 2 MG tablet Take 2 mg by mouth at bedtime.    Historical Provider, MD  triamterene-hydrochlorothiazide (MAXZIDE-25) 37.5-25 MG per tablet Take 1 tablet by mouth daily. 07/03/13   Historical  Provider, MD      Allergies  Allergen Reactions  . Tetracyclines & Related Other (See Comments)    Causes pains     Social History:  reports that she has been smoking Cigarettes.  She has been smoking about 0.50 packs per day. She does not have any smokeless tobacco history on file. She reports that she drinks alcohol. She reports that she does not use illicit drugs.     Family History  Problem Relation Age of Onset  . Stroke Father   .  Alzheimer's disease Mother        Physical Exam:  GEN:  Pleasant  Obese Elderly Appearing  63 y.o. African American female examined and in no acute distress; cooperative with exam Filed Vitals:   08/19/13 1837 08/19/13 1845 08/19/13 1900 08/19/13 1954  BP: 127/76 128/81 118/68 133/76  Pulse: 107 102 104 99  Temp:    98.4 F (36.9 C)  TempSrc:    Oral  Resp: 22 20 17 18   Height:    5\' 7"  (1.702 m)  Weight:    75.025 kg (165 lb 6.4 oz)  SpO2: 100% 100% 100% 97%   Blood pressure 133/76, pulse 99, temperature 98.4 F (36.9 C), temperature source Oral, resp. rate 18, height 5\' 7"  (1.702 m), weight 75.025 kg (165 lb 6.4 oz), SpO2 97.00%. PSYCH: She is alert and oriented x4; does not appear anxious does not appear depressed; affect is normal HEENT: Normocephalic and Atraumatic, Mucous membranes pink; PERRLA; EOM intact; Fundi:  Benign;  No scleral icterus, Nares: Patent, Oropharynx: Clear, Fair Dentition,    Neck:  FROM, No Cervical Lymphadenopathy nor Thyromegaly or Carotid Bruit; No JVD; Breasts:: Not examined CHEST WALL: No tenderness CHEST: Normal respiration, clear to auscultation bilaterally HEART: Regular rate and rhythm; no murmurs rubs or gallops BACK: No kyphosis or scoliosis; No CVA tenderness ABDOMEN: Positive Bowel Sounds, Obese, Soft Non-Tender; No Masses, No Organomegaly, No Pannus; No Intertriginous candida. Rectal Exam: Not done EXTREMITIES: No Cyanosis, Clubbing, or Edema; No Ulcerations. Genitalia: not examined PULSES: 2+ and symmetric SKIN: Normal hydration no rash or ulceration  CNS:   Mental Status:  Alert, Oriented, Thought Content Innappropriate. Speech with evidence of Expressive Aphasia. Able to follow 3 step commands without difficulty.  In No obvious pain.   Cranial Nerves:  II: Discs flat bilaterally; Visual fields Intact, Pupils equal and reactive.    III,IV, VI: Extra-ocular motions intact bilaterally    V,VII: smile symmetric, facial light touch  sensation normal bilaterally    VIII: hearing decreasesd bilaterally    IX,X: gag reflex present    XI: bilateral shoulder shrug    XII: midline tongue extension   Motor:  Right:  Upper extremity 5/5     Left:  Upper extremity 5/5     Right:  Lower extremity 5/5    Left:  Lower extremity 5/5     Tone and Bulk:  normal tone throughout; no atrophy noted   Sensory:  Pinprick and light touch intact throughout, bilaterally   Deep Tendon Reflexes: 2+ and symmetric throughout   Plantars/ Babinski:  Right: normal Left: normal    Cerebellar:  Finger to nose with or without difficulty.   Gait: deferred   Vascular: pulses palpable throughout    Labs on Admission:  Basic Metabolic Panel:  Recent Labs Lab 08/19/13 1549 08/19/13 1620  NA 137 136*  K 3.4* 3.1*  CL 93* 94*  CO2 23  --   GLUCOSE 142* 150*  BUN 13 12  CREATININE 0.99 1.10  CALCIUM 10.2  --    Liver Function Tests:  Recent Labs Lab 08/19/13 1549  AST 17  ALT 13  ALKPHOS 119*  BILITOT 0.5  PROT 8.3  ALBUMIN 4.2   No results found for this basename: LIPASE, AMYLASE,  in the last 168 hours No results found for this basename: AMMONIA,  in the last 168 hours CBC:  Recent Labs Lab 08/19/13 1549 08/19/13 1620  WBC 10.1  --   NEUTROABS 7.7  --   HGB 15.8* 17.3*  HCT 43.6 51.0*  MCV 82.4  --   PLT 406*  --    Cardiac Enzymes: No results found for this basename: CKTOTAL, CKMB, CKMBINDEX, TROPONINI,  in the last 168 hours  BNP (last 3 results) No results found for this basename: PROBNP,  in the last 8760 hours CBG:  Recent Labs Lab 08/19/13 1557  GLUCAP 147*    Radiological Exams on Admission: Dg Chest 2 View  08/19/2013   CLINICAL DATA:  Chest pain  EXAM: CHEST  2 VIEW  COMPARISON:  None.  FINDINGS: The cardiac shadow is within normal limits. The lungs are clear bilaterally. A calcified granuloma is noted in the right upper lung. No sizable infiltrate or effusion is seen. No bony abnormality is  noted.  IMPRESSION: No active cardiopulmonary disease.   Electronically Signed   By: Inez Catalina M.D.   On: 08/19/2013 17:27   Ct Head (brain) Wo Contrast  08/19/2013   CLINICAL DATA:  Altered mental status.  Difficulty with speech.  EXAM: CT HEAD WITHOUT CONTRAST  TECHNIQUE: Contiguous axial images were obtained from the base of the skull through the vertex without intravenous contrast.  COMPARISON:  01/10/2010.  FINDINGS: No evidence of an acute infarct, acute hemorrhage, mass lesion, mass effect or hydrocephalus. Visualized portions of the paranasal sinuses and mastoid air cells are clear.  IMPRESSION: No acute findings.   Electronically Signed   By: Lorin Picket M.D.   On: 08/19/2013 17:07     EKG: Independently reviewed.  Sinus Tachycardia at 113,  Ischemic Changes in Antero- Lateral Leads   Assessment/Plan:   63 y.o. female with  Principal Problem:   1.   CVA (cerebral infarction)/Aphasia complicating stroke/Altered mental status   CVA Protocol Workup, Neuro Checks, and Telemetry Monitoring   MRI/MRA, Carotid US and 2D ECHO  In AM   Check Fasting Lipids, and HbA1C.   ASA Rx.     Active Problems:   2.   Hypokalemia   Replete K+    Check Magnesium Level     3.   Essential hypertension, benign   Continue HCTZ Rx   Monitor BPs     4.   Marijuana Use   Counsel Re: Cessation    5.  Fibromyalgia   Pain control PRN    6. Abnl EKG findings  Send troponins     7.  DVT Prophylaxis  Lovenox      Code Status:    FULL CODE Family Communication:    No Family present Disposition Plan:       Inpatient  Time spent:  [place] [facility] Pager [phone]   If [place] Please Contact the Day Rounding Team MD for [facility]  If 7PM-7AM, Please Contact Night-Floor Coverage  [email or web address] Password [facility] 08/19/2013, 9:16 PM

## 2013-08-19 NOTE — ED Notes (Signed)
Patient was unable to urinate for urine sample

## 2013-08-19 NOTE — Consult Note (Signed)
Referring Physician: Gasper Lloyd    Chief Complaint: Altered mental status with confusion and abnormal speech output.  HPI: [name] [name] is an 63 y.o. female with a history of hypertension, fibromyalgia and osteoporosis as well as chronic anxiety, presenting with new onset altered mental status. Patient was initially noted yesterday to be somewhat confused. She was noted to still be confused today with speech output being at times nonsensical. No focal abnormality has been noted. CT scan of her head showed no acute intracranial abnormality. Laboratory studies were unremarkable except for minimal hypokalemia. Blood sugar was 147. Urine drug screen was positive for THC.  LSN: Afternoon of 08/18/2013 tPA Given: No: Beyond time under for treatment consideration MRankin: 2  Past Medical History  Diagnosis Date  . Hypertension   . Fibromyalgia   . Osteoporosis     Family History  Problem Relation Age of Onset  . Stroke Father   . Alzheimer's disease Mother      Medications: I have reviewed the patient's current medications.  ROS: Unobtainable due to the patient's mental status changes.  Physical Examination: Blood pressure 133/76, pulse 99, temperature 98.4 F (36.9 C), temperature source Oral, resp. rate 18, height 5\' 7"  (1.702 m), weight 75.025 kg (165 lb 6.4 oz), SpO2 97.00%.  Neurologic Examination: Mental Status: Alert, with moderately severe aphasia with frequent paraphasic errors as well as receptive component. Will speech was normal, without hesitancy. She tended to perseverate with verbal responses. Able to follow commands fairly well. Cranial Nerves: II-Visual fields were normal. III/IV/VI-Pupils were equal and reacted. Extraocular movements were full and conjugate.    V/VII-no facial numbness and no facial weakness. VIII-normal. X-no dysarthria; symmetrical palatal movement. Motor: 5/5 bilaterally with normal tone and bulk Sensory: Normal throughout. Deep Tendon  Reflexes: Trace to 1+ and symmetric. Plantars: Flexor bilaterally Cerebellar: Normal finger-to-nose testing.  Dg Chest 2 View  08/19/2013   CLINICAL DATA:  Chest pain  EXAM: CHEST  2 VIEW  COMPARISON:  None.  FINDINGS: The cardiac shadow is within normal limits. The lungs are clear bilaterally. A calcified granuloma is noted in the right upper lung. No sizable infiltrate or effusion is seen. No bony abnormality is noted.  IMPRESSION: No active cardiopulmonary disease.   Electronically Signed   By: Inez Catalina M.D.   On: 08/19/2013 17:27   Ct Head (brain) Wo Contrast  08/19/2013   CLINICAL DATA:  Altered mental status.  Difficulty with speech.  EXAM: CT HEAD WITHOUT CONTRAST  TECHNIQUE: Contiguous axial images were obtained from the base of the skull through the vertex without intravenous contrast.  COMPARISON:  01/10/2010.  FINDINGS: No evidence of an acute infarct, acute hemorrhage, mass lesion, mass effect or hydrocephalus. Visualized portions of the paranasal sinuses and mastoid air cells are clear.  IMPRESSION: No acute findings.   Electronically Signed   By: Lorin Picket M.D.   On: 08/19/2013 17:07    Assessment: 63 y.o. 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  Stroke Risk Factors - hypertension  Plan: 1. HgbA1c, fasting lipid panel 2. MRI, MRA  of the brain without contrast 3. PT consult, OT consult, Speech consult 4. Echocardiogram 5. Carotid dopplers 6. Prophylactic therapy-Antiplatelet med: Aspirin 325 mg per day orally on 300 mg daily suppository 7. Risk factor modification 8. Telemetry monitoring   C.R.  Nicole Kindred, MD [facility] [phone]  08/19/2013, 8:46 PM

## 2013-08-19 NOTE — ED Notes (Addendum)
PT conversing with granddaughter and answering intermittent questions appropriately with intermittent inappropriate words at this time.

## 2013-08-19 NOTE — ED Notes (Signed)
Pt presents from home via [facility] with AMS. EMS reports that the patient lives alone and a friend picked her up yesterday and noticed that the patient was acting "differently". This friend returned to check on the patient today and noted a smell of gas in the home, patient holding a tube of toothpaste to her ear and talking about a toothache. Pt is alert and following commands. PT able to state name and date of birth, when asked where she is pt responded "In cold bing bong and I stand thing frying thing". Pt unable to state situation or time. Pt occasionally repeating what this RN says.  Pt denies pain, is calm, cooperative and in NAD.

## 2013-08-19 NOTE — Progress Notes (Signed)
Patient arrived to floor via stretcher from ED. Pt continues to have aphasia and incomprehensible words. Pt is being monitored by telemetry and is settled into the room. VSS. Will continue to monitor.

## 2013-08-19 NOTE — ED Notes (Signed)
Dr. Aline Brochure aware of critical condition and is at bedside.

## 2013-08-20 ENCOUNTER — Observation Stay (HOSPITAL_COMMUNITY): Payer: Medicare Other

## 2013-08-20 LAB — LIPID PANEL
Cholesterol: 166 mg/dL (ref 0–200)
HDL: 59 mg/dL (ref >39)
LDL CALC: 94 mg/dL (ref 0–99)
TRIGLYCERIDES: 67 mg/dL (ref <150)
Total CHOL/HDL Ratio: 2.8 RATIO
VLDL: 13 mg/dL (ref 0–40)

## 2013-08-20 LAB — HEMOGLOBIN A1C
HEMOGLOBIN A1C: 6.2 % — AB (ref <5.7)
Mean Plasma Glucose: 131 mg/dL — ABNORMAL HIGH (ref <117)

## 2013-08-20 LAB — TROPONIN I

## 2013-08-20 LAB — MAGNESIUM: MAGNESIUM: 1.9 mg/dL (ref 1.5–2.5)

## 2013-08-20 MED ORDER — POTASSIUM CHLORIDE CRYS ER 20 MEQ PO TBCR
40.0000 meq | EXTENDED_RELEASE_TABLET | Freq: Once | ORAL | Status: AC
  Administered 2013-08-20: 40 meq via ORAL
  Filled 2013-08-20: qty 2

## 2013-08-20 MED ORDER — ASPIRIN 81 MG PO CHEW
81.0000 mg | CHEWABLE_TABLET | Freq: Every day | ORAL | Status: DC
  Administered 2013-08-20 – 2013-08-21 (×2): 81 mg via ORAL
  Filled 2013-08-20 (×2): qty 1

## 2013-08-20 NOTE — Evaluation (Signed)
Speech Language Pathology Evaluation Patient Details Name: [name] [name] MRN: [id number] DOB: 1950-08-16 Today's Date: 08/20/2013 Time: 0102-7253 SLP Time Calculation (min): 25 min  Problem List:  Patient Active Problem List   Diagnosis Date Noted  . Altered mental status 08/19/2013  . CVA (cerebral infarction) 08/19/2013  . Aphasia complicating stroke 66/44/0347  . Essential hypertension, benign 08/19/2013  . Hypokalemia 08/19/2013  . Marijuana use 08/19/2013  . Fibromyalgia 08/19/2013   Past Medical History:  Past Medical History  Diagnosis Date  . Hypertension   . Fibromyalgia   . Osteoporosis    Past Surgical History:  Past Surgical History  Procedure Laterality Date  . Cholecystectomy     HPI:  [name] [name] is a 63 y.o. female with a history of HTN, and Fibromyalgia who presented to the ED with complaints of confusion and problems with her speech for the past 2 days.    Assessment / Plan / Recommendation Clinical Impression  Pt presents with moderate impairments with basic cognitive-linguistic function, primarily in the areas of sustained attention, storage of new information, and basic problem solving. Throughout functional tasks, SLP provided environmental modifications and Mod verbal cueing for redirection to task in a quiet environment. Pt is very echolalic in conversation with noted anomia, although suspect that cognitive status plays a large role in linguistic errors. Pt will benefit from skilled SLP services while in acute care. Recommend 24/7 supervision and OP SLP services upon discharge to maximize functional independence.    SLP Assessment  Patient needs continued Speech Lanaguage Pathology Services    Follow Up Recommendations  24 hour supervision/assistance;Outpatient SLP    Frequency and Duration min 2x/week  2 weeks   Pertinent Vitals/Pain Pain Assessment: No/denies pain   SLP Goals  SLP Goals Potential to Achieve Goals: Good Potential  Considerations: Ability to learn/carryover information  SLP Evaluation Prior Functioning  Cognitive/Linguistic Baseline: Information not available Type of Home:  (unsure secondary to no family available)  Lives With: Alone Available Help at Discharge: Family;Available 24 hours/day (per RN, pt is to live with granddaughter/daughter)   Cognition  Overall Cognitive Status: Impaired/Different from baseline Arousal/Alertness: Awake/alert Orientation Level: Disoriented to person;Disoriented to time;Oriented to place;Disoriented to situation Attention: Sustained Sustained Attention: Impaired Sustained Attention Impairment: Verbal basic;Functional basic Memory: Impaired Memory Impairment: Storage deficit;Decreased recall of new information Awareness: Impaired Awareness Impairment: Anticipatory impairment Problem Solving: Impaired Problem Solving Impairment: Functional basic Executive Function:  (all impaired due to lower level deficits) Safety/Judgment: Impaired    Comprehension  Auditory Comprehension Overall Auditory Comprehension: Impaired Yes/No Questions: Impaired Basic Biographical Questions: 76-100% accurate Basic Immediate Environment Questions: 75-100% accurate Complex Questions: 75-100% accurate Paragraph Comprehension (via yes/no questions): 26-50% accurate Commands: Impaired One Step Basic Commands: 75-100% accurate Two Step Basic Commands: 50-74% accurate Multistep Basic Commands: 0-24% accurate Conversation: Simple Interfering Components: Attention;Working Curator: Not tested Reading Comprehension Reading Status: Not tested    Expression Expression Primary Mode of Expression: Verbal Verbal Expression Overall Verbal Expression: Impaired Initiation: No impairment Automatic Speech: Name;Social Response Level of Generative/Spontaneous Verbalization: Conversation Repetition: No impairment Naming:  Impairment Confrontation: Within functional limits Other Naming Comments: anomia noted in conversation - question impact of cognitive abilities Verbal Errors: Echolalia Pragmatics: No impairment Interfering Components: Attention Non-Verbal Means of Communication: Not applicable Written Expression Dominant Hand: Right Written Expression: Not tested   Oral / Motor Motor Speech Overall Motor Speech: Appears within functional limits for tasks assessed   GO Functional Assessment Tool Used: skilled clinical judgment Functional Limitations:  Attention Attention Current Status (K0254): At least 40 percent but less than 60 percent impaired, limited or restricted Attention Goal Status (Y7062): At least 20 percent but less than 40 percent impaired, limited or restricted     Germain Osgood, M.A. CCC-SLP [phone]  Germain Osgood 08/20/2013, 3:39 PM

## 2013-08-20 NOTE — Progress Notes (Signed)
Stroke Team Progress Note  HISTORY [name] [name] is an 63 y.o. female with a history of hypertension, fibromyalgia and osteoporosis as well as chronic anxiety, presenting with new onset altered mental status. Patient was initially noted yesterday 08/18/2013 to be somewhat confused. She was noted to still be confused today 08/19/2013 with speech output being at times nonsensical. No focal abnormality has been noted. CT scan of her head showed no acute intracranial abnormality. Laboratory studies were unremarkable except for minimal hypokalemia. Blood sugar was 147. Urine drug screen was positive for THC.  Patient was not administered TPA secondary to delay in arrival. She was admitted for further evaluation and treatment.  SUBJECTIVE Her RN and transporter for MRI is at the bedside.  Overall she feels her condition is stable. She has no memory of event from yesterday. She has a daughter who lives in [place].   OBJECTIVE Most recent Vital Signs: Filed Vitals:   08/20/13 0004 08/20/13 0200 08/20/13 0400 08/20/13 0600  BP: 116/59 128/82 133/75 97/68  Pulse: 94 100 117 107  Temp: 98 F (36.7 C) 99 F (37.2 C) 98.4 F (36.9 C) 98.6 F (37 C)  TempSrc: Oral Oral Oral Oral  Resp: 16 16 18 18   Height:      Weight:      SpO2: 97% 98% 98% 100%   CBG (last 3)   Recent Labs  08/19/13 1557  GLUCAP 147*    IV Fluid Intake:     MEDICATIONS  . ALPRAZolam  0.5 mg Oral Daily  . citalopram  10 mg Oral Daily  . enoxaparin (LOVENOX) injection  40 mg Subcutaneous Q24H  . hydrochlorothiazide  25 mg Oral Daily  . pantoprazole  40 mg Oral Daily  . risperiDONE  2 mg Oral QHS   PRN:  senna-docusate  Diet:  Cardiac thin liquids Activity: OOB with assistance DVT Prophylaxis:  Lovenox 40 mg sq daily   CLINICALLY SIGNIFICANT STUDIES Basic Metabolic Panel:  Recent Labs Lab 08/19/13 1549 08/19/13 1620  NA 137 136*  K 3.4* 3.1*  CL 93* 94*  CO2 23  --   GLUCOSE 142* 150*  BUN 13 12   CREATININE 0.99 1.10  CALCIUM 10.2  --    Liver Function Tests:  Recent Labs Lab 08/19/13 1549  AST 17  ALT 13  ALKPHOS 119*  BILITOT 0.5  PROT 8.3  ALBUMIN 4.2   CBC:  Recent Labs Lab 08/19/13 1549 08/19/13 1620  WBC 10.1  --   NEUTROABS 7.7  --   HGB 15.8* 17.3*  HCT 43.6 51.0*  MCV 82.4  --   PLT 406*  --    Coagulation:  Recent Labs Lab 08/19/13 1549  LABPROT 13.7  INR 1.05   Cardiac Enzymes:  Recent Labs Lab 08/19/13 2319 08/20/13 0309  TROPONINI <0.30 <0.30   Urinalysis:  Recent Labs Lab 08/19/13 1650  COLORURINE YELLOW  LABSPEC 1.021  PHURINE 6.0  GLUCOSEU NEGATIVE  HGBUR MODERATE*  BILIRUBINUR MODERATE*  KETONESUR >80*  PROTEINUR 100*  UROBILINOGEN 1.0  NITRITE NEGATIVE  LEUKOCYTESUR NEGATIVE   Lipid Panel    Component Value Date/Time   CHOL 166 08/20/2013 0309   TRIG 67 08/20/2013 0309   HDL 59 08/20/2013 0309   CHOLHDL 2.8 08/20/2013 0309   VLDL 13 08/20/2013 0309   LDLCALC 94 08/20/2013 0309   HgbA1C  No results found for this basename: HGBA1C    Urine Drug Screen:     Component Value Date/Time   LABOPIA NONE DETECTED  08/19/2013 1650   COCAINSCRNUR NONE DETECTED 08/19/2013 1650   LABBENZ NONE DETECTED 08/19/2013 1650   AMPHETMU NONE DETECTED 08/19/2013 1650   THCU POSITIVE* 08/19/2013 1650   LABBARB NONE DETECTED 08/19/2013 1650    Alcohol Level: No results found for this basename: ETH,  in the last 168 hours   CT of the brain  08/19/2013    No acute findings.   MRI of the brain    MRA of the brain    Carotid Doppler    2D Echocardiogram    CXR  08/19/2013   No active cardiopulmonary disease.    EKG  sinus bradycardia. For complete results please see formal report.   Therapy Recommendations   Physical Exam   Middle-aged pleasant African American lady currently not in distress.Awake alert. Afebrile. Head is nontraumatic. Neck is supple without bruit. Hearing is normal. Cardiac exam no murmur or gallop. Lungs are clear to  auscultation. Distal pulses are well felt. Neurological Exam ;  Awake  Alert disoriented x 3. Follows only occasional one-step commands. Speech intermittently clear and has intermittent word finding difficulties present. Able to name and repeat quite well..eye movements full without nystagmus.fundi were not visualized. Vision acuity and fields appear normal. Hearing is normal. Palatal movements are normal. Face symmetric. Tongue midline. Normal strength, tone, reflexes and coordination. Normal sensation. Gait deferred. ASSESSMENT [name] is a 63 y.o. female presenting with Altered mental status with confusion and abnormal speech output. Imaging pending. Suspect left brain infarct. On no antithrombotics prior to admission. Now on no antithrombotics for secondary stroke prevention. Patient with resultant confusion, no aphasia (discrepancy between language output with talking and reading). Stroke work up underway.   hypertension  THC positive this admission  LDL 94  Family hx stroke (father)  Tremor - followed by Dr. Leta Baptist, last seen 7/27, for f/u in 6 months  Remote hx seizure  Psych history  Hospital day # 1  TREATMENT/PLAN  Add aspirin 81 mg orally every day in the event the MRI is positive for stroke. If MRI negative, can be discontinued.  Stroke workup - mri, mra, 2D, carotid doppler  Check EEG  SHARON BIBY, MSN, RN, ANVP-BC, ANP-BC, GNP-BC [facility] Pager: [phone] 08/20/2013 10:31 AM I have personally examined this patient, reviewed notes, independently viewed imaging studies, participated in medical decision making and plan of care. I have made any additions or clarifications directly to the above note. Agree with note above. In the absence of any other focal findings on exam it is unclear to me whether her confusion is from delirium or represents a true aphasia and brain imaging study will help   clarify  Antony Contras, [place] Pager: [phone] 08/20/2013 3:04 PM   SIGNED    To contact Stroke Continuity provider, please refer to [email or web address] After hours, contact General Neurology

## 2013-08-20 NOTE — Progress Notes (Signed)
OT Cancellation Note  Patient Details Name: [name] [name] MRN: [id number] DOB: 28-Jan-1950   Cancelled Treatment:    Reason Eval/Treat Not Completed: Patient at procedure or test/ unavailable (Patient transferring to MRI procedure). Will try back later today.   Amyiah Gaba, MS, OTR/L, CLT 08/20/2013, 10:19 AM

## 2013-08-20 NOTE — Evaluation (Signed)
Occupational Therapy Evaluation Patient Details Name: [name] [name] MRN: [id number] DOB: October 02, 1950 Today's Date: 08/20/2013    History of Present Illness [name] [name] is a 63 y.o. female with a history of HTN, and fibromyalgia who presents to the ED with complaints of confusion and problems with her speech for the past 2 days, not making sense, and having inappropriate answers to questions.  Had a CT scan of her head which was negative.     Clinical Impression   Patient admitted with above. Patient independent PTA. Feel patient will benefit from acute OT to enhance overall performance with functional tasks. Patient is currently min guard for tasks and requires up to moderate assistance for basic cognition. Goals set at an overall supervision level. Per RN, plan is for patient to d/c > daughters house in [place] and have 24/7 supervision. Unable to assess vision, please assess further in functional context. No additional follow up OT recommended at this time, patient will require 24/7 supervision once discharged secondary to decreased cognition.      Follow Up Recommendations  No OT follow up;Supervision/Assistance - 24 hour    Equipment Recommendations  None recommended by OT (none recommended at this time)    Recommendations for Other Services  (n/a at this time)     Precautions / Restrictions Precautions Precautions: Fall Precaution Comments:  (Patient with poor carryover and decreased cognition) Restrictions Weight Bearing Restrictions: No      Mobility  Transfers Overall transfer level: Needs assistance Equipment used: None Transfers: Sit to/from Stand;Stand Pivot Transfers Sit to Stand: Min guard Stand pivot transfers: Min guard       General transfer comment:  (decreased safety awareness secondary to decreased cog)    Balance Overall balance assessment: Needs assistance         Standing balance support: No upper extremity supported Standing balance-Leahy  Scale: Good (stepping over tub/shower threshold without use of grab bars)                              ADL Overall ADL's : Needs assistance/impaired     Grooming: Min guard                   Toilet Transfer: Min guard;Comfort height toilet Toilet Transfer Details (indicate cue type and reason):  (ambulating <>toilet without an AE with min guard assist) Toileting- Water quality scientist and Hygiene: Min guard;Sit to/from stand   Tub/ Shower Transfer: Nurse, learning disability Details (indicate cue type and reason):  (stepping over tub/shower threshold) Functional mobility during ADLs: Min guard General ADL Comments: Patient functioning at an overall min guard physical assist level. Patient able to perform toilet transfer & toileting at min guard level, then ambulated > gym for simulated tub/shower transfer(stepping over threshold). Patient's biggest barrier is cognition at this time.      Vision  Unable to fully assess secondary to decreased cognition. Will further assess in functional setting.                           Pertinent Vitals/Pain Pain Assessment: No/denies pain     Hand Dominance Right   Extremity/Trunk Assessment Upper Extremity Assessment Upper Extremity Assessment: Overall WFL for tasks assessed (Bilateral strength =4/5)   Lower Extremity Assessment Lower Extremity Assessment: Defer to PT evaluation   Cervical / Trunk Assessment Cervical / Trunk Assessment: Normal   Communication Communication Communication: Receptive difficulties;Expressive difficulties  Cognition Arousal/Alertness: Awake/alert   Overall Cognitive Status: Impaired/Different from baseline Area of Impairment: Orientation;Attention;Memory;Following commands;Safety/judgement;Awareness;Problem solving Orientation Level: Disoriented to;Time;Situation Current Attention Level: Selective Memory: Decreased recall of precautions;Decreased short-term  memory Following Commands: Follows multi-step commands inconsistently;Follows one step commands consistently Safety/Judgement: Decreased awareness of safety;Decreased awareness of deficits Awareness: Intellectual Problem Solving: Requires verbal cues;Difficulty sequencing General Comments: Pt cannot recall how to use phone or how to call for help              Home Living Family/patient expects to be discharged to:: Private residence Living Arrangements: Children;Other relatives Available Help at Discharge: Family;Available 24 hours/day (per RN, pt is to live with granddaughter/daughter) Type of Home:  (unsure secondary to no family available)                              Lives With: Alone    Prior Functioning/Environment Level of Independence: Independent        Comments:  (Plan is for patient to d/c>daughters house in [place])    OT Diagnosis: Generalized weakness;Cognitive deficits   OT Problem List: Decreased activity tolerance;Impaired balance (sitting and/or standing);Decreased cognition;Decreased safety awareness   OT Treatment/Interventions: Self-care/ADL training;Therapeutic exercise;Cognitive remediation/compensation;Patient/family education;Balance training    OT Goals(Current goals can be found in the care plan section) Acute Rehab OT Goals Patient Stated Goal: unable to fully verbalize OT Goal Formulation: Patient unable to participate in goal setting Time For Goal Achievement: 08/27/13 Potential to Achieve Goals: Good ADL Goals Pt Will Perform Grooming: with supervision;standing (supervision secondary to decreased cognitive status) Pt Will Perform Upper Body Bathing: with supervision;sitting (supervision secondary to decreased cognitive status) Pt Will Perform Lower Body Bathing: with supervision;sit to/from stand (supervision secondary to decreased cognitive status) Pt Will Perform Upper Body Dressing: with supervision;sitting (supervision  secondary to decreased cognitive status) Pt Will Perform Lower Body Dressing: with supervision;sit to/from stand (supervision secondary to decreased cognitive status) Pt Will Transfer to Toilet: with supervision;ambulating (supervision secondary to decreased cognitive status) Pt Will Perform Toileting - Clothing Manipulation and hygiene: with supervision;sit to/from stand (supervision secondary to decreased cognitive status) Pt Will Perform Tub/Shower Transfer: with supervision (No DME, supervision secondary to decreased cognitive status)  OT Frequency: Min 2X/week   Barriers to D/C:  (none known at this time)             End of Session Equipment Utilized During Treatment: Gait belt Nurse Communication: Other (comment) (family situation, d/c plan)  Activity Tolerance: Patient tolerated treatment well Patient left: in chair;with chair alarm set;with call bell/phone within reach;with nursing/sitter in room   Time: 7867-6720 OT Time Calculation (min): 30 min Charges:  OT General Charges $OT Visit: 1 Procedure OT Evaluation $Initial OT Evaluation Tier I: 1 Procedure OT Treatments $Self Care/Home Management : 8-22 mins (20) G-Codes: OT G-codes **NOT FOR INPATIENT CLASS** Functional Assessment Tool Used: clinical judgement Functional Limitation: Self care Self Care Current Status (N4709): At least 1 percent but less than 20 percent impaired, limited or restricted; min guard Self Care Goal Status (G2836): At least 1 percent but less than 20 percent impaired, limited or restricted; supervision Jocelyn Nold , MS, OTR/L, CLT  08/20/2013, 3:34 PM

## 2013-08-20 NOTE — ED Provider Notes (Signed)
Medical screening examination/treatment/procedure(s) were conducted as a shared visit with non-physician practitioner(s) and myself.  I personally evaluated the patient during the encounter.   EKG Interpretation   Date/Time:  Wednesday August 19 2013 15:27:01 EDT Ventricular Rate:  113 PR Interval:  142 QRS Duration: 75 QT Interval:  306 QTC Calculation: 419 R Axis:   13 Text Interpretation:  Sinus tachycardia Consider left atrial enlargement  Low voltage, precordial leads Abnormal T, consider ischemia, diffuse leads  no previous for comparison Confirmed by Avika Carbine  MD, Ondria Oswald ([id number]) on  08/19/2013 4:05:37 PM      I interviewed and examined the patient. Lungs are CTAB. Cardiac exam wnl. Abdomen soft.  Normal strength and sensation in all extremities. Pt appears to have expressive aphasia. Can tell me her name. Possibly TIA/CVA. Will work up accordingly and admit.   Pamella Pert, MD 08/20/13 1623

## 2013-08-20 NOTE — Progress Notes (Signed)
[facility]                                                                              Patient Demographics  [name] [name], is a 63 y.o. female, DOB - [birth year], OIZ:[id number]  Admit date - 08/19/2013   Admitting Physician Theressa Millard, MD  Outpatient Primary MD for the patient is PROVIDER NOT [facility]  LOS - 1   Chief Complaint  Patient presents with  . Altered Mental Status      HPI on 08/19/2013 [name] [name] is a 63 y.o. female with a history of HTN, and Fibromyalgia who presented to the ED with complaints of confusion and problems with her speech for the past 2 days. Per her family, she was last seen normal 2 days before admission. She has been speaking and not making sense, and having inappropriate answers to questions. She denied having any headache or chest pain. She was evaluated in the EDd and had a CT scan of her Head which was negative for acute findings, and Neurology was consulted. She was referred for further evaluation.    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 ALPRAZolam  0.5 mg  Oral Daily  . aspirin  81 mg Oral Daily  . citalopram  10 mg Oral Daily  . enoxaparin (LOVENOX) injection  40 mg Subcutaneous Q24H  . hydrochlorothiazide  25 mg Oral Daily  . pantoprazole  40 mg Oral Daily  . risperiDONE  2 mg Oral QHS   Continuous Infusions:  PRN Meds:.senna-docusate  Antibiotics    Anti-infectives   None        Subjective:   [name] [name] seen and examined today.  Patient still appears to be confused and unable to answer questions appropriately, however, follows commands.  Objective:   Filed Vitals:   08/20/13 0200 08/20/13 0400 08/20/13 0600 08/20/13 1011  BP: 128/82 133/75 97/68 131/76  Pulse: 100 117 107 114  Temp: 99 F (37.2 C) 98.4 F (36.9 C) 98.6 F (37 C) 98.2 F (36.8 C)  TempSrc: Oral Oral Oral Oral  Resp: 16 18 18 20   Height:      Weight:      SpO2: 98% 98% 100% 99%    Wt Readings from Last 3 Encounters:  08/19/13 75.025 kg (165 lb 6.4 oz)  08/03/13 77.565 kg (171 lb)    No intake or output data in the 24 hours ending 08/20/13  1307  Exam  General: Well developed, well nourished, NAD, appears stated age  HEENT: NCAT, PERRLA, EOMI, Anicteic Sclera, mucous membranes moist.   Neck: Supple, no JVD, no masses  Cardiovascular: S1 S2 auscultated, no rubs, murmurs or gallops. Regular rate and rhythm.  Respiratory: Clear to auscultation bilaterally with equal chest rise  Abdomen: Soft, nontender, nondistended, + bowel sounds  Extremities: warm dry without cyanosis clubbing or edema  Neuro: Awake and alert, can follow commands, but cannot answer questions appropriately, strength 5/5 in upper/lower ext, CN intact as tested  Skin: Without rashes exudates or nodules  Psych: unable to assess  Data Review   Micro Results No results found for this or any previous visit (from the past 240 hour(s)).  Radiology Reports Dg Chest 2 View  08/19/2013   CLINICAL DATA:  Chest pain  EXAM: CHEST  2 VIEW  COMPARISON:  None.  FINDINGS:  The cardiac shadow is within normal limits. The lungs are clear bilaterally. A calcified granuloma is noted in the right upper lung. No sizable infiltrate or effusion is seen. No bony abnormality is noted.  IMPRESSION: No active cardiopulmonary disease.   Electronically Signed   By: Inez Catalina M.D.   On: 08/19/2013 17:27   Ct Head (brain) Wo Contrast  08/19/2013   CLINICAL DATA:  Altered mental status.  Difficulty with speech.  EXAM: CT HEAD WITHOUT CONTRAST  TECHNIQUE: Contiguous axial images were obtained from the base of the skull through the vertex without intravenous contrast.  COMPARISON:  01/10/2010.  FINDINGS: No evidence of an acute infarct, acute hemorrhage, mass lesion, mass effect or hydrocephalus. Visualized portions of the paranasal sinuses and mastoid air cells are clear.  IMPRESSION: No acute findings.   Electronically Signed   By: Lorin Picket M.D.   On: 08/19/2013 17:07     CBC  Recent Labs Lab 08/19/13 1549 08/19/13 1620  WBC 10.1  --   HGB 15.8* 17.3*  HCT 43.6 51.0*  PLT 406*  --   MCV 82.4  --   MCH 29.9  --   MCHC 36.2*  --   RDW 13.3  --   LYMPHSABS 1.5  --   MONOABS 0.8  --   EOSABS 0.0  --   BASOSABS 0.0  --     Chemistries   Recent Labs Lab 08/19/13 1549 08/19/13 1620  NA 137 136*  K 3.4* 3.1*  CL 93* 94*  CO2 23  --   GLUCOSE 142* 150*  BUN 13 12  CREATININE 0.99 1.10  CALCIUM 10.2  --   AST 17  --   ALT 13  --   ALKPHOS 119*  --   BILITOT 0.5  --    ------------------------------------------------------------------------------------------------------------------ estimated creatinine clearance is 55.4 ml/min (by C-G formula based on Cr of 1.1). ------------------------------------------------------------------------------------------------------------------ No results found for this basename: HGBA1C,  in the last 72  hours ------------------------------------------------------------------------------------------------------------------  Recent Labs  08/20/13 0309  CHOL 166  HDL 59  LDLCALC 94  TRIG 67  CHOLHDL 2.8   ------------------------------------------------------------------------------------------------------------------ No results found for this basename: TSH, T4TOTAL, FREET3, T3FREE, THYROIDAB,  in the last 72 hours ------------------------------------------------------------------------------------------------------------------ No results found for this basename: VITAMINB12, FOLATE, FERRITIN, TIBC, IRON, RETICCTPCT,  in the last 72 hours  Coagulation profile  Recent Labs Lab 08/19/13 1549  INR 1.05    No results found for this basename: DDIMER,  in the last 72 hours  Cardiac Enzymes  Recent Labs Lab 08/19/13 2319 08/20/13 0309 08/20/13 0930  TROPONINI <0.30 <0.30 <0.30   ------------------------------------------------------------------------------------------------------------------ No components found with this basename: POCBNP,     Shelma Eiben D.O. on 08/20/2013 at 1:07 PM  Between 7am to 7pm - Pager - [phone]  After 7pm go to [email or web address] - password [email or web address]  And look for the night coverage person covering for me after hours  [facility] Office  [phone]

## 2013-08-20 NOTE — Plan of Care (Signed)
Problem: Acute Rehab PT Goals(only PT should resolve) Goal: Pt Will Go Up/Down Stairs With no railing With no railing

## 2013-08-20 NOTE — Plan of Care (Signed)
Problem: Acute Rehab PT Goals(only PT should resolve) Goal: Pt Will Go Up/Down Stairs Without railing     

## 2013-08-20 NOTE — Progress Notes (Signed)
UR Completed Vantasia Pinkney Graves-Bigelow, RN,BSN [phone]  

## 2013-08-20 NOTE — Evaluation (Signed)
Physical Therapy Evaluation Patient Details Name: [name] [name] MRN: [id number] DOB: 28-Feb-1950 Today's Date: 08/20/2013   History of Present Illness  [name] [name] is a 63 y.o. female with a history of HTN, and fibromyalgia who presents to the ED with complaints of confusion and problems with her speech for the past 2 days, not making sense, and having inappropriate answers to questions.  Had a CT scan of her head which was negative.   (Also history of HTN and osteoporosis.)  Clinical Impression  Pt was impulsive and unfocused with PT about safety, did verbalize the instructions then could not remember them.  Does not fully understand the call light and required chair alarm.  Spoke with nsg about these issues and agreed pt should be screened for CIR.    Follow Up Recommendations CIR;SNF;Supervision/Assistance - 24 hour    Equipment Recommendations  Rolling walker with 5" wheels    Recommendations for Other Services Rehab consult     Precautions / Restrictions Precautions Precautions: Fall Precaution Comments: Pt unable to retain instructions,  Cannot use call light on command. Restrictions Weight Bearing Restrictions: No      Mobility  Bed Mobility Overal bed mobility: Modified Independent                Transfers Overall transfer level: Needs assistance Equipment used: None Transfers: Sit to/from [employer] Transfers Sit to Stand: Min guard Stand pivot transfers: Min guard       General transfer comment: impulsive and unsafe alone, forgets safety and sequencing with uncontrolled sit descent  Ambulation/Gait Ambulation/Gait assistance: Min guard Ambulation Distance (Feet): 200 Feet Assistive device: None;1 person hand held assist Gait Pattern/deviations: Step-through pattern;Wide base of support;Drifts right/left;Decreased dorsiflexion - right;Decreased dorsiflexion - left Gait velocity: slow Gait velocity interpretation: Below normal speed for  age/gender    Stairs Stairs: Yes Stairs assistance: Min assist Stair Management: Two rails;No rails;Alternating pattern;Step to pattern Number of Stairs: 20 General stair comments: Needs handrails to not lose balance when depending on RLE in descent, very safe and r eciprocal with railing  Wheelchair Mobility    Modified Rankin (Stroke Patients Only) Modified Rankin (Stroke Patients Only) Pre-Morbid Rankin Score: No symptoms Modified Rankin: Moderate disability     Balance Overall balance assessment: Needs assistance Sitting-balance support: Feet supported Sitting balance-Leahy Scale: Fair   Postural control: Right lateral lean;Other (comment) (minimally) Standing balance support: No upper extremity supported;During functional activity Standing balance-Leahy Scale: Poor Standing balance comment: when focused with PT can balance, with hand hold by PT.  when standing without contact is unsafe and easily thrown off balance                             Pertinent Vitals/Pain Pain Assessment: No/denies pain BP was 136/76, pulse 114, O2 sat 99% per nsg notes.    Home Living Family/patient expects to be discharged to:: Unsure                 Additional Comments: Pt lives with granddaughter who is working during the day    Prior Function Level of Independence: Independent         Comments: Home is 2 story with stairs no rails to enter, no assistive equipment per pt     Hand Dominance   Dominant Hand: Right    Extremity/Trunk Assessment   Upper Extremity Assessment: Overall WFL for tasks assessed           Lower  Extremity Assessment: RLE deficits/detail RLE Deficits / Details: Has active movmement but in standing lists occasionally to R mainly when distracted    Cervical / Trunk Assessment: Normal  Communication   Communication: Expressive difficulties  Cognition Arousal/Alertness: Awake/alert Behavior During Therapy: Impulsive Overall  Cognitive Status: Impaired/Different from baseline Area of Impairment: Orientation;Memory;Following commands;Safety/judgement;Awareness;Problem solving Orientation Level: Place;Time;Situation   Memory: Decreased recall of precautions;Decreased short-term memory Following Commands: Follows one step commands inconsistently Safety/Judgement: Decreased awareness of safety;Decreased awareness of deficits Awareness: Emergent Problem Solving: Requires verbal cues;Requires tactile cues;Difficulty sequencing General Comments: Pt cannot recall how to use phone or how to call for help    General Comments General comments (skin integrity, edema, etc.): Tinetti demonstrates moderate fall risk    Exercises        Assessment/Plan    PT Assessment Patient needs continued PT services  PT Diagnosis Abnormality of gait   PT Problem List Decreased strength;Decreased range of motion;Decreased activity tolerance;Decreased balance;Decreased mobility;Decreased coordination;Decreased cognition;Decreased safety awareness;Decreased knowledge of precautions  PT Treatment Interventions Gait training;Stair training;Functional mobility training;Therapeutic activities;Therapeutic exercise;Balance training;Neuromuscular re-education;Patient/family education   PT Goals (Current goals can be found in the Care Plan section) Acute Rehab PT Goals Patient Stated Goal: unable to fully verbalize PT Goal Formulation: With patient Time For Goal Achievement: 09/03/13 Potential to Achieve Goals: Good    Frequency Min 4X/week   Barriers to discharge Inaccessible home environment;Decreased caregiver support Home alone during the day, previously very independent and not needing help    Co-evaluation               End of Session Equipment Utilized During Treatment: Gait belt Activity Tolerance: Patient tolerated treatment well Patient left: in chair;with chair alarm set;with call bell/phone within reach Nurse  Communication: Mobility status;Precautions;Other (comment) (CIR vs SNF)         Time: 0272-5366 PT Time Calculation (min): 30 min   Charges:   PT Evaluation $Initial PT Evaluation Tier I: 1 Procedure PT Treatments $Gait Training: 8-22 mins   PT G Codes:          Ramond Dial 08-24-2013, 10:27 AM  Mee Hives, PT MS Acute Rehab Dept. Number: [phone]

## 2013-08-20 NOTE — Progress Notes (Signed)
SLP Cancellation Note  Patient Details Name: [name] [name] MRN: [id number] DOB: Nov 28, 1950   Cancelled treatment:       Reason Eval/Treat Not Completed: Patient at procedure or test/unavailable.  SLP will follow up as able.  Gunnar Fusi, M.A., CCC-SLP [phone]  Avis 08/20/2013, 2:01 PM

## 2013-08-20 NOTE — Progress Notes (Signed)
  Echocardiogram 2D Echocardiogram has been performed.  [name] [name] 08/20/2013, 1:49 PM

## 2013-08-20 NOTE — Progress Notes (Signed)
Routine EEG completed, results pending. 

## 2013-08-20 NOTE — Progress Notes (Signed)
Bilateral carotid artery duplex completed:  1-39% ICA stenosis.  Vertebral artery flow is antegrade.     

## 2013-08-21 DIAGNOSIS — R1310 Dysphagia, unspecified: Secondary | ICD-10-CM

## 2013-08-21 LAB — BASIC METABOLIC PANEL
ANION GAP: 18 — AB (ref 5–15)
BUN: 12 mg/dL (ref 6–23)
CHLORIDE: 93 meq/L — AB (ref 96–112)
CO2: 23 mEq/L (ref 19–32)
Calcium: 9.4 mg/dL (ref 8.4–10.5)
Creatinine, Ser: 1.05 mg/dL (ref 0.50–1.10)
GFR, EST AFRICAN AMERICAN: 64 mL/min — AB (ref >90)
GFR, EST NON AFRICAN AMERICAN: 55 mL/min — AB (ref >90)
Glucose, Bld: 118 mg/dL — ABNORMAL HIGH (ref 70–99)
POTASSIUM: 3.3 meq/L — AB (ref 3.7–5.3)
SODIUM: 134 meq/L — AB (ref 137–147)

## 2013-08-21 MED ORDER — POTASSIUM CHLORIDE CRYS ER 20 MEQ PO TBCR
40.0000 meq | EXTENDED_RELEASE_TABLET | Freq: Two times a day (BID) | ORAL | Status: AC
  Administered 2013-08-21 (×2): 40 meq via ORAL
  Filled 2013-08-21 (×2): qty 2

## 2013-08-21 MED ORDER — DIPHENHYDRAMINE HCL 25 MG PO CAPS
25.0000 mg | ORAL_CAPSULE | Freq: Once | ORAL | Status: AC
  Administered 2013-08-21: 25 mg via ORAL
  Filled 2013-08-21: qty 1

## 2013-08-21 MED ORDER — LEVETIRACETAM 250 MG PO TABS
250.0000 mg | ORAL_TABLET | Freq: Two times a day (BID) | ORAL | Status: DC
  Administered 2013-08-21 – 2013-08-22 (×3): 250 mg via ORAL
  Filled 2013-08-21 (×3): qty 1

## 2013-08-21 NOTE — Clinical Social Work Psychosocial (Signed)
Clinical Social Work Department BRIEF PSYCHOSOCIAL ASSESSMENT 08/21/2013  Patient:  Mcgurn,Sinai     Account Number:  [id number]     Admit date:  08/19/2013  Clinical Social Worker:  Domenica Reamer, CLINICAL SOCIAL WORKER  Date/Time:  08/21/2013 03:17 PM  Referred by:  Physician  Date Referred:  08/21/2013 Referred for  SNF Placement   Other Referral:   none at this time   Interview type:  Family Other interview type:    PSYCHOSOCIAL DATA Living Status:  ALONE Admitted from facility:   Level of care:   Primary support name:  Michelle Nasuti Primary support relationship to patient:  CHILD, ADULT Degree of support available:   Patient has a high level of support.  Patient's daughter reported that patient's grandaughter lives in [place] and visits patient frequently and that patient's best friend is also visiting often.  Patient's daughter reported that patient will come to live with her in [place], [place] (near [place]) once patient completes CIR/SNF.    CURRENT CONCERNS Current Concerns  Post-Acute Placement   Other Concerns:   none    SOCIAL WORK ASSESSMENT / PLAN CSW talked to patients daughter (patient is currently not oriented) to discuss SNF back-up for PT's CIR recommendations.  Patient's daughter was agreeable to SNF placement but was not interested in [place] but preferred for patient to go to a SNF in [place].  CSW informed patients daughter that we do not usually work with these counties so she would need to provide a list of the facilities she would like the patient to go to if she is not admited to CIR.  Patient's daughter reported that the patient had been living with her until recently but that the patient would be coming back to live with her after patient discharge. CSW is awaiting patient's daughter to call back with SNF options.  CSW will continue to follow for discharge needs.   Assessment/plan status:   Psychosocial Support/Ongoing Assessment of Needs Other assessment/ plan:   FL2, PASAR   Information/referral to community resources:   offered [employer]- patient's daughter not agreeable to this plan    PATIENT'S/FAMILY'S RESPONSE TO PLAN OF CARE: Patients daughter was agreeable to SNF stating that she knows she can not care for her mother alone at this time. Patient's daughter is concerned about her mother's health and expressed concerns that her mother might be developing early onset Alzheimers which patients daughter stated runs in their family.  Patient's daughter was appreciative of CSW support in this process.     Domenica Reamer, [place] Social Worker [phone]

## 2013-08-21 NOTE — Progress Notes (Signed)
Speech Language Pathology Treatment: Cognitive-Linquistic  Patient Details Name: [name] [name] MRN: [id number] DOB: 03-03-1950 Today's Date: 08/21/2013 Time: 1430-1450 SLP Time Calculation (min): 20 min  Assessment / Plan / Recommendation Clinical Impression  Pt demonstrates improved cognitive-linguistic function today as compared to previous date. She was alert and oriented x4, and accurately recalled three words given to her by OT that morning with Mod I with extra time. Pt participated in structured cognitive-linguistic tasks with Min cues provided by SLP for divergent naming. With additional linguistic restrictions put on task, pt required Mod cues from SLP. This therapist provided Mod multimodal cues for sustained attention to tasks. Continue to recommend OP SLP services and 24/7 supervision upon d/c home with family.   HPI HPI: [name] [name] is a 63 y.o. female with a history of HTN, and Fibromyalgia who presented to the ED with complaints of confusion and problems with her speech for the past 2 days.    Pertinent Vitals Pain Assessment: No/denies pain  SLP Plan  Continue with current plan of care    Recommendations                Follow up Recommendations: Outpatient SLP;24 hour supervision/assistance Plan: Continue with current plan of care    GO      Germain Osgood, M.A. CCC-SLP [phone]  Germain Osgood 08/21/2013, 3:00 PM

## 2013-08-21 NOTE — Progress Notes (Signed)
Stroke Team Progress Note  HISTORY [name] [name] is an 63 y.o. female with a history of hypertension, fibromyalgia and osteoporosis as well as chronic anxiety, presenting with new onset altered mental status. Patient was initially noted yesterday 08/18/2013 to be somewhat confused. She was noted to still be confused today 08/19/2013 with speech output being at times nonsensical. No focal abnormality has been noted. CT scan of her head showed no acute intracranial abnormality. Laboratory studies were unremarkable except for minimal hypokalemia. Blood sugar was 147. Urine drug screen was positive for THC.  Patient was not administered TPA secondary to delay in arrival. She was admitted for further evaluation and treatment.  SUBJECTIVE No complaints this am. Confusion and memory are improving. EEG is abnormal suggestive of epileptiform activity. No witnessed seizure activity noted  OBJECTIVE Most recent Vital Signs: Filed Vitals:   08/20/13 2120 08/21/13 0136 08/21/13 0515 08/21/13 1014  BP: 106/72 120/71 102/69 116/73  Pulse: 103 102 97 105  Temp: 98.4 F (36.9 C) 98.5 F (36.9 C) 98 F (36.7 C) 98.4 F (36.9 C)  TempSrc: Oral Oral Oral Oral  Resp: 20 20 20 20   Height:      Weight:      SpO2: 100% 100% 99% 100%   CBG (last 3)   Recent Labs  08/19/13 1557  GLUCAP 147*    IV Fluid Intake:     MEDICATIONS  . ALPRAZolam  0.5 mg Oral Daily  . aspirin  81 mg Oral Daily  . citalopram  10 mg Oral Daily  . enoxaparin (LOVENOX) injection  40 mg Subcutaneous Q24H  . hydrochlorothiazide  25 mg Oral Daily  . pantoprazole  40 mg Oral Daily  . potassium chloride  40 mEq Oral BID  . risperiDONE  2 mg Oral QHS   PRN:  senna-docusate  Diet:  Cardiac thin liquids Activity: OOB with assistance DVT Prophylaxis:  Lovenox 40 mg sq daily   CLINICALLY SIGNIFICANT STUDIES Basic Metabolic Panel:   Recent Labs Lab 08/19/13 1549 08/19/13 1620 08/20/13 0930 08/21/13 0350  NA 137 136*  --   134*  K 3.4* 3.1*  --  3.3*  CL 93* 94*  --  93*  CO2 23  --   --  23  GLUCOSE 142* 150*  --  118*  BUN 13 12  --  12  CREATININE 0.99 1.10  --  1.05  CALCIUM 10.2  --   --  9.4  MG  --   --  1.9  --    Liver Function Tests:   Recent Labs Lab 08/19/13 1549  AST 17  ALT 13  ALKPHOS 119*  BILITOT 0.5  PROT 8.3  ALBUMIN 4.2   CBC:   Recent Labs Lab 08/19/13 1549 08/19/13 1620  WBC 10.1  --   NEUTROABS 7.7  --   HGB 15.8* 17.3*  HCT 43.6 51.0*  MCV 82.4  --   PLT 406*  --    Coagulation:   Recent Labs Lab 08/19/13 1549  LABPROT 13.7  INR 1.05   Cardiac Enzymes:   Recent Labs Lab 08/19/13 2319 08/20/13 0309 08/20/13 0930  TROPONINI <0.30 <0.30 <0.30   Urinalysis:   Recent Labs Lab 08/19/13 1650  COLORURINE YELLOW  LABSPEC 1.021  PHURINE 6.0  GLUCOSEU NEGATIVE  HGBUR MODERATE*  BILIRUBINUR MODERATE*  KETONESUR >80*  PROTEINUR 100*  UROBILINOGEN 1.0  NITRITE NEGATIVE  LEUKOCYTESUR NEGATIVE   Lipid Panel    Component Value Date/Time   CHOL 166 08/20/2013  0309   TRIG 67 08/20/2013 0309   HDL 59 08/20/2013 0309   CHOLHDL 2.8 08/20/2013 0309   VLDL 13 08/20/2013 0309   LDLCALC 94 08/20/2013 0309   HgbA1C  Lab Results  Component Value Date   HGBA1C 6.2* 08/20/2013    Urine Drug Screen:     Component Value Date/Time   LABOPIA NONE DETECTED 08/19/2013 1650   COCAINSCRNUR NONE DETECTED 08/19/2013 1650   LABBENZ NONE DETECTED 08/19/2013 1650   AMPHETMU NONE DETECTED 08/19/2013 1650   THCU POSITIVE* 08/19/2013 1650   LABBARB NONE DETECTED 08/19/2013 1650    Alcohol Level: No results found for this basename: ETH,  in the last 168 hours   CT of the brain  08/19/2013    No acute findings.   MRI & MRA of the brain  08/20/2013    1. Intermittently motion degraded but negative for age non contrast MRI appearance of the brain. 2. 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 Followup Doppler ultrasound of the cervical carotid arteries or neck CTA/MRA would evaluate further. 3. Otherwise negative intracranial MRA; normal anatomic variation of the ACA anatomy.     Carotid Doppler  1-39% ICA stenosis. Vertebral artery flow is antegrade.   2D Echocardiogram  EF 55-60% with no source of embolus.   CXR  08/19/2013   No active cardiopulmonary disease.    EKG  sinus bradycardia. For complete results please see formal report.   EEG this is an abnormal awake EEG because of the above described findings. 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   Therapy Recommendations CIR  Physical Exam   Middle-aged pleasant African American lady currently not in distress.Awake alert. Afebrile. Head is nontraumatic. Neck is supple without bruit. Hearing is normal. Cardiac exam no murmur or gallop. Lungs are clear to auscultation. Distal pulses are well felt. Neurological Exam ;  Awake  Alert oriented x 2. Follows only occasional one-step commands. Speech clear and has occasional word finding difficulties present. Able to name and repeat quite well..eye movements full without nystagmus.fundi were not visualized. Vision acuity and fields appear normal. Hearing is normal. Palatal movements are normal. Face symmetric. Tongue midline. Normal strength, tone, reflexes and coordination. Normal sensation. Gait deferred.  ASSESSMENT [name] is a 63 y.o. female presenting with Altered mental status with confusion and abnormal speech output. MRI negative for stroke. Dx:  Seizure with post ictal confusion that is resolving.  On no antithrombotics prior to admission. Now on aspirin  81 mg orally every day for secondary stroke prevention. PT recommending CIR   hypertension  THC positive this admission  LDL 94  Family hx stroke (father)  Tremor - followed by Dr. Leta [name], last seen 7/27, for f/u in 6 months  Remote hx seizure. EEG positive for seizure this admission. Not on antiepileptics prior to admission  Psych history  Hospital day # 2  TREATMENT/PLAN  Will discontinue  aspirin 81 mg orally every day as patient does not have a stroke or TIA dx.   Agree with rehab consult given PT recommendations  No further stroke workup indicated.  Ongoing risk factor control by Primary Care Physician  Stroke Service will sign off. Please call  should any needs arise.  Follow up with Dr. Leta [name] in 2 months  Burnetta Sabin, MSN, RN, ANVP-BC, ANP-BC, [facility] Pager: [phone] 08/21/2013 10:16 AM I have personally examined this patient, reviewed notes, independently viewed imaging studies, participated in medical decision making and plan of care. I have made any additions or clarifications directly to the above note. Agree with note above. She likely has seizures as the  cause of her confusion delirium and memory loss and will start her on Keppra 250 twice daily. Follow up with Dr. Leta [name] as outpatient   To contact Stroke Continuity provider, please refer to [email or web address] After hours, contact General Neurology

## 2013-08-21 NOTE — Progress Notes (Signed)
[facility]                                                                              Patient Demographics  [name] [name], is a 63 y.o. female, DOB - Jun 09, 1950, VCB:[id number]  Admit date - 08/19/2013   Admitting Physician Theressa Millard, MD  Outpatient Primary MD for the patient is [facility] NOT [facility]  LOS - 2   Chief Complaint  Patient presents with  . Altered Mental Status      HPI on 08/19/2013 [name] [name] is a 63 y.o. female with a history of HTN, and Fibromyalgia who presented to the ED with complaints of confusion and problems with her speech for the past 2 days. Per her family, she was last seen normal 2 days before admission. She has been speaking and not making sense, and having inappropriate answers to questions. She denied having any headache or chest pain. She was evaluated in the EDd and had a CT scan of her Head which was negative for acute findings, and Neurology was consulted. She was referred for further evaluation.    Assessment & Plan  Acute Encephalopathy with abnormal speech, likely secondary to Seizure -CT head: no acute findings -MRI/MRA: Intermittently motion degraded but negative for age non contrast MRI appearance of the brain.  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  Followup Doppler ultrasound of the cervical carotid arteries or neck CTA/MRA would evaluate further. Otherwise negative intracranial MRA; normal anatomic variation of  the ACA anatomy. 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 -Echocardiogram: EF 38-46%, grade 1 diastolic dysfunction -Carotid doppler: Bilateral: 1-39% ICA stenosis. Vertebral artery flow is antegrade. -LDL 94, HbA1c 6.2 -Continue Aspirin -PT consulted and recommended CIR, CIR consulted and pending evaluation -OT had no further recommendations -Speech recommended outpatient therapy -Patient started on keppra  Hypokalemia -Likely secondary to diuretic use -Will continue to replace -Magnesium 1.9  Essential hyprtension -Controlled, continue HCTZ  Marijuana Use -Urine drug screen positive -Will need counseling  Depression/Anxiety/Fibromyalgia -Continue xanax, celexa, and risperdal  Abnormal EKG finding -Troponins negative  Code Status: Full  Family Communication: None at bedside  Disposition Plan: Admitted, pending CIR consult and eval  Time Spent in minutes   25 minutes  Procedures  Echocardiogram Study Conclusions - Left ventricle: The cavity size was normal. Wall thickness was increased in a pattern of mild LVH. Systolic function was normal. The estimated ejection fraction was in the range of 60% to 65%. Wall motion was normal; there were no regional wall motion abnormalities. Doppler parameters are consistent with abnormal left ventricular relaxation (grade 1 diastolic dysfunction). - Aortic root: The aortic root was mildly dilated. Impressions: No cardiac source of embolism was identified, but cannot be ruled out on the basis of this examination.  Carotid doppler: Bilateral: 1-39% ICA stenosis. Vertebral artery flow is  antegrade.  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  Consults   Neurology  DVT Prophylaxis  Lovenox  Lab  Results  Component Value Date   PLT 406* 08/19/2013    Medications  Scheduled Meds: . ALPRAZolam  0.5 mg Oral Daily  . citalopram  10 mg Oral Daily  . enoxaparin (LOVENOX) injection  40 mg Subcutaneous Q24H  . hydrochlorothiazide  25 mg Oral Daily  . levETIRAcetam  250 mg Oral BID  . pantoprazole  40 mg Oral Daily  . potassium chloride  40 mEq Oral BID  . risperiDONE  2 mg Oral QHS   Continuous Infusions:  PRN Meds:.senna-docusate  Antibiotics    Anti-infectives   None        Subjective:   [name] [name] seen and examined today.  Patient able to answer more questions today.  She has no complaints today and denies chest pain, shortness of breath, bone pain, headache, dizziness.  Objective:   Filed Vitals:   08/20/13 2120 08/21/13 0136 08/21/13 0515 08/21/13 1014  BP: 106/72 120/71 102/69 116/73  Pulse: 103 102 97 105  Temp: 98.4 F (36.9 C) 98.5 F (36.9 C) 98 F (36.7 C) 98.4 F (36.9 C)  TempSrc: Oral Oral Oral Oral  Resp: 20 20 20 20   Height:      Weight:      SpO2: 100% 100% 99% 100%    Wt Readings from Last 3 Encounters:  08/19/13 75.025 kg (165 lb 6.4 oz)  08/03/13 77.565 kg (171 lb)    No intake or output data in the 24 hours ending 08/21/13 1141  Exam  General: Well developed, well nourished, NAD, appears stated age  HEENT: NCAT, mucous membranes moist.   Cardiovascular: S1 S2 auscultated, no rubs, murmurs or gallops. Regular rate and rhythm.  Respiratory: Clear to auscultation bilaterally with equal chest rise  Abdomen: Soft, nontender, nondistended, + bowel sounds  Extremities: warm dry without cyanosis clubbing or edema  Neuro: AAO x3 (does not know year), no focal deficits   Data Review   Micro Results No results found for this or any previous visit (from the past 240 hour(s)).  Radiology Reports Dg Chest 2 View  08/19/2013   CLINICAL DATA:  Chest pain  EXAM: CHEST  2 VIEW  COMPARISON:  None.  FINDINGS: The cardiac shadow is  within normal limits. The lungs are clear bilaterally. A calcified granuloma is noted in the right upper lung. No sizable infiltrate or effusion is seen. No bony abnormality is noted.  IMPRESSION: No active cardiopulmonary disease.   Electronically Signed   By: Inez Catalina M.D.   On: 08/19/2013 17:27   Ct Head (brain) Wo Contrast  08/19/2013   CLINICAL DATA:  Altered mental status.  Difficulty with speech.  EXAM: CT HEAD WITHOUT CONTRAST  TECHNIQUE: Contiguous axial images were obtained from the base of the skull through the vertex without intravenous contrast.  COMPARISON:  01/10/2010.  FINDINGS: No evidence of an acute infarct, acute hemorrhage, mass lesion, mass effect or hydrocephalus. Visualized portions of the paranasal sinuses and mastoid air cells are clear.  IMPRESSION: No acute findings.   Electronically Signed   By: Lorin Picket M.D.   On: 08/19/2013 17:07     CBC  Recent Labs Lab 08/19/13  1549 08/19/13 1620  WBC 10.1  --   HGB 15.8* 17.3*  HCT 43.6 51.0*  PLT 406*  --   MCV 82.4  --   MCH 29.9  --   MCHC 36.2*  --   RDW 13.3  --   LYMPHSABS 1.5  --   MONOABS 0.8  --   EOSABS 0.0  --   BASOSABS 0.0  --     Chemistries   Recent Labs Lab 08/19/13 1549 08/19/13 1620 08/20/13 0930 08/21/13 0350  NA 137 136*  --  134*  K 3.4* 3.1*  --  3.3*  CL 93* 94*  --  93*  CO2 23  --   --  23  GLUCOSE 142* 150*  --  118*  BUN 13 12  --  12  CREATININE 0.99 1.10  --  1.05  CALCIUM 10.2  --   --  9.4  MG  --   --  1.9  --   AST 17  --   --   --   ALT 13  --   --   --   ALKPHOS 119*  --   --   --   BILITOT 0.5  --   --   --    ------------------------------------------------------------------------------------------------------------------ estimated creatinine clearance is 58 ml/min (by C-G formula based on Cr of 1.05). ------------------------------------------------------------------------------------------------------------------  Recent Labs  08/20/13 0309    HGBA1C 6.2*   ------------------------------------------------------------------------------------------------------------------  Recent Labs  08/20/13 0309  CHOL 166  HDL 59  LDLCALC 94  TRIG 67  CHOLHDL 2.8   ------------------------------------------------------------------------------------------------------------------ No results found for this basename: TSH, T4TOTAL, FREET3, T3FREE, THYROIDAB,  in the last 72 hours ------------------------------------------------------------------------------------------------------------------ No results found for this basename: VITAMINB12, FOLATE, FERRITIN, TIBC, IRON, RETICCTPCT,  in the last 72 hours  Coagulation profile  Recent Labs Lab 08/19/13 1549  INR 1.05    No results found for this basename: DDIMER,  in the last 72 hours  Cardiac Enzymes  Recent Labs Lab 08/19/13 2319 08/20/13 0309 08/20/13 0930  TROPONINI <0.30 <0.30 <0.30   ------------------------------------------------------------------------------------------------------------------ No components found with this basename: POCBNP,     [name] [name] D.O. on 08/21/2013 at 11:41 AM  Between 7am to 7pm - Pager - [phone]  After 7pm go to [email or web address] - password [email or web address]  And look for the night coverage person covering for me after hours  [facility] Office  [phone]

## 2013-08-21 NOTE — Progress Notes (Signed)
Occupational Therapy Treatment Patient Details Name: [name] [name] MRN: [id number] DOB: [birth year] Today's Date: 08/21/2013    History of present illness [name] [name] is a 63 y.o. female with a history of HTN, and fibromyalgia who presents to the ED with complaints of confusion and problems with her speech for the past 2 days, not making sense, and having inappropriate answers to questions.  Had a CT scan of her head which was negative.     OT comments  Patient with improved cognition today, oriented X4. Patient able to verbalize d/c plan > daughters house in [employer]. Patient functioning at an overall supervision level.  Supervision LTGs met at this time. Discharging patient from acute OT services. Continue to recommend 24/7 supervision once discharged secondary to cognition; patient aware of this recommendation and stated understanding during OT session.   Follow Up Recommendations  No OT follow up;Supervision/Assistance - 24 hour    Equipment Recommendations  3 in 1 bedside comode    Recommendations for Other Services  (none at this time)    Precautions / Restrictions Precautions Precautions: Fall Restrictions Weight Bearing Restrictions: No       Mobility  Transfers Overall transfer level: Needs assistance Equipment used: None Transfers: Sit to/from [employer] Transfers Sit to Stand: Supervision Stand pivot transfers: Supervision       General transfer comment: Patient stated, I wait to get up for safety.        ADL Overall ADL's : Needs assistance/impaired     Grooming: Supervision/safety               Lower Body Dressing: Supervision/safety   Toilet Transfer: Supervision/safety   Toileting- Clothing Manipulation and Hygiene: Supervision/safety   Tub/ Shower Transfer: English as a second language teacher Details (indicate cue type and reason):  (tub/shower unit. Recommending 3-in-1 for use at home) Functional mobility during ADLs:  Supervision/safety General ADL Comments: Patient's overall independence with ADLs and functional mobility/transfers has improved since yesterday; patient overall supervision secondary to patient continues to present with decreased cognition.       Vision                 Additional Comments: Patient with difficult time tracking during ROM evaluation. Patient states she normally wears glasses, but they are not available at this time.          Cognition   Behavior During Therapy: WFL for tasks assessed/performed Overall Cognitive Status: Impaired/Different from baseline (increased cognition since yesterday) Area of Impairment: Awareness;Memory Orientation Level:  (patient oriented X4 during this OT session)      Following Commands: Follows multi-step commands consistently       General Comments: Patient able to independenty recall 3/3 words (beginning, middle, end of session) after therapist gave them. Patient did have difficulty with speech of expressing words.                 Pertinent Vitals/ Pain       Pain Assessment: No/denies pain            Progress Toward Goals  OT Goals(current goals can now be found in the care plan section)  Progress towards OT goals: Goals met/education completed, patient discharged from [place] goals met and education completed, patient discharged from OT services          Activity Tolerance Patient tolerated treatment well   Patient Left in chair;with call bell/phone within reach;with chair alarm set   Nurse Communication Mobility status  Time: 1735-6701 OT Time Calculation (min): 23 min  Charges: OT General Charges $OT Visit: 1 Procedure OT Treatments $Self Care/Home Management : 23-37 mins  Janel Beane , [place], OTR/L, CLT [phone] 08/21/2013, 2:30 PM

## 2013-08-21 NOTE — Procedures (Signed)
EEG report.  Brief clinical history: 63 y.o. female presenting with Altered mental status with confusion and abnormal speech output.   Technique: this is a 17 channel routine scalp EEG performed at the bedside with bipolar and monopolar montages arranged in accordance to the international 10/20 system of electrode placement. One channel was dedicated to EKG recording.  No drowsiness or sleep achieved during recording. No activating procedures.  Description:In the wakeful state, the best background consisted of a medium amplitude, posterior dominant, well sustained, symmetric and reactive 9 Hz rhythm. Frequent, intermittent, maily independent bitemporal epileptiform discharges noted.  There is nearly continuous and semi rhythmic sharply contoured slowing over the left fronto-temporo-parietal region which doesn't evolve. In addition, there is evidence of intermittent right temporal slowing. No electrographic seizures noted.    EKG showed sinus rhythm.  Impression: this is an abnormal awake EEG because of the above described findings. 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 Clinical correlation is advised.   Dorian Pod, MD

## 2013-08-21 NOTE — Progress Notes (Signed)
Thank you for consult on [name]. PT evaluation reveals patient at min guard assist for ambulating 200 feet. No OT needs noted. ST recommending outpatient follow up. Anticipate progress to supervision level with PT over next 24-48 hours.  She is too high level for CIR and would recommend outpatient therapy with 24 hours supervision by family. Will defer CIR for now.

## 2013-08-22 DIAGNOSIS — R569 Unspecified convulsions: Secondary | ICD-10-CM

## 2013-08-22 LAB — BASIC METABOLIC PANEL
Anion gap: 15 (ref 5–15)
BUN: 10 mg/dL (ref 6–23)
CO2: 24 mEq/L (ref 19–32)
Calcium: 10.1 mg/dL (ref 8.4–10.5)
Chloride: 97 mEq/L (ref 96–112)
Creatinine, Ser: 1.04 mg/dL (ref 0.50–1.10)
GFR calc Af Amer: 65 mL/min — ABNORMAL LOW (ref >90)
GFR calc non Af Amer: 56 mL/min — ABNORMAL LOW (ref >90)
GLUCOSE: 129 mg/dL — AB (ref 70–99)
Potassium: 3.8 mEq/L (ref 3.7–5.3)
Sodium: 136 mEq/L — ABNORMAL LOW (ref 137–147)

## 2013-08-22 MED ORDER — POTASSIUM CHLORIDE ER 10 MEQ PO TBCR: 10.0000 meq | EXTENDED_RELEASE_TABLET | Freq: Every day | ORAL | Status: DC

## 2013-08-22 MED ORDER — LEVETIRACETAM 250 MG PO TABS: 250.0000 mg | ORAL_TABLET | Freq: Two times a day (BID) | ORAL | Status: DC

## 2013-08-22 MED ORDER — NICOTINE 21 MG/24HR TD PT24: 21.0000 mg | MEDICATED_PATCH | Freq: Every day | TRANSDERMAL | Status: DC

## 2013-08-22 MED ORDER — NICOTINE 21 MG/24HR TD PT24
21.0000 mg | MEDICATED_PATCH | Freq: Every day | TRANSDERMAL | Status: DC
  Administered 2013-08-22: 21 mg via TRANSDERMAL
  Filled 2013-08-22 (×2): qty 1

## 2013-08-22 MED ORDER — CITALOPRAM HYDROBROMIDE 10 MG PO TABS: 10.0000 mg | ORAL_TABLET | Freq: Every day | ORAL | Status: DC

## 2013-08-22 MED ORDER — GABAPENTIN 100 MG PO CAPS
100.0000 mg | ORAL_CAPSULE | Freq: Three times a day (TID) | ORAL | Status: DC
Start: 2013-08-22 — End: 2013-08-22
  Administered 2013-08-22: 100 mg via ORAL
  Filled 2013-08-22: qty 1

## 2013-08-22 MED ORDER — ALPRAZOLAM 0.5 MG PO TABS: 0.5000 mg | ORAL_TABLET | Freq: Every day | ORAL | Status: DC | PRN

## 2013-08-22 MED ORDER — GABAPENTIN 100 MG PO CAPS: 100.0000 mg | ORAL_CAPSULE | Freq: Three times a day (TID) | ORAL | Status: DC

## 2013-08-22 NOTE — Discharge Instructions (Signed)

## 2013-08-22 NOTE — Discharge Summary (Signed)
Physician Discharge Summary  [name] [name] UTM:[id number] DOB: 04/19/1950 DOA: 08/19/2013  PCP: PROVIDER NOT IN SYSTEM  Admit date: 08/19/2013 Discharge date: 08/22/2013  Time spent: 30 minutes  Recommendations for Outpatient Follow-up:  Patient will be discharged to home with home health, PT.  She should continue her medications as prescribed. Patient should follow a heart healthy diet. Patient should follow up with her primary care physician within one week of discharge.  Patient should also have a BMP in one week of discharge to monitor her potassium. She should also follow up with neurology within one to 2 months.  Discharge Diagnoses:  Acute encephalopathy with abnormal speech, likely secondary to seizure Hypokalemia Essential hypertension Marijuana use Depression/anxiety/fibromyalgia Abnormal EKG  Discharge Condition: Stable  Diet recommendation: Heart healthy  Filed Weights   08/19/13 1954  Weight: 75.025 kg (165 lb 6.4 oz)    History of present illness:  on 08/19/2013  [name] [name] is a 63 y.o. female with a history of HTN, and Fibromyalgia who presented to the ED with complaints of confusion and problems with her speech for the past 2 days. Per her family, she was last seen normal 2 days before admission. She has been speaking and not making sense, and having inappropriate answers to questions. She denied having any headache or chest pain. She was evaluated in the EDd and had a CT scan of her Head which was negative for acute findings, and Neurology was consulted. She was referred for further evaluation.   Hospital Course:  Acute Encephalopathy with abnormal speech, likely secondary to Seizure  -CT head: no acute findings  -MRI/MRA: Intermittently motion degraded but negative for age non contrast MRI appearance of the brain.  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 Followup Doppler ultrasound of the cervical carotid arteries or neck CTA/MRA would evaluate further. Otherwise negative intracranial MRA; normal anatomic variation of the ACA anatomy.  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  -Echocardiogram: EF 56-81%, grade 1 diastolic dysfunction  -Carotid doppler: Bilateral: 1-39% ICA stenosis. Vertebral artery flow is antegrade.  -LDL 94, HbA1c 6.2  -Continue Aspirin  -PT consulted and recommended CIR, CIR consulted and pending evaluation  -OT had no further recommendations  -Speech recommended outpatient therapy  -Patient started on keppra  -Patient currently back to her baseline, and does not wish to go a nursing facility, will discharge patient to home with her granddaughter and/or daughter and home health PT -Had a long conversation with patient starter regarding possible reasons for her recurrent episodes including possible dementia. Per daughter, patient had a diagnosis of early Parkinson's disease. Patient should be seen by neurologist as an outpatient for further evaluation of questionable dementia.  Hypokalemia  -Likely secondary to diuretic use  -Resolved, currently 3.8 -Magnesium 1.9   Essential hyprtension  -Controlled, continue HCTZ   Marijuana Use  -Urine drug screen positive  -Will need counseling   Depression/Anxiety/Fibromyalgia  -Continue xanax, celexa, and risperdal   Abnormal EKG finding  -Troponins negative   Echocardiogram  Study Conclusions  - Left ventricle: The cavity size was normal. Wall thickness was increased in a pattern of mild LVH. Systolic function  was normal. The estimated ejection fraction was in the range of 60% to 65%. Wall motion was normal; there were no regional wall motion abnormalities. Doppler parameters are consistent with abnormal left ventricular relaxation (grade 1 diastolic dysfunction). - Aortic root: The aortic root was mildly dilated. Impressions: No cardiac source of embolism was identified, but cannot be ruled out on the basis of this examination.  Carotid doppler: Bilateral: 1-39% ICA stenosis. Vertebral artery flow is antegrade.   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   Consults  Neurology     Discharge Exam: Filed Vitals:   08/22/13 1000  BP: 121/74  Pulse: 79  Temp: 98.4 F (36.9 C)  Resp: 18   Exam  General: Well developed, well nourished, NAD, appears stated age  HEENT: NCAT, mucous membranes moist.  Cardiovascular: S1 S2 auscultated, no rubs, murmurs or gallops. Regular rate and rhythm.  Respiratory: Clear to auscultation bilaterally with equal chest rise  Abdomen: Soft, nontender, nondistended, + bowel sounds  Extremities: warm dry without cyanosis clubbing or edema  Neuro: AAO x3 (does not know year), no focal deficits   Discharge Instructions      Discharge Instructions   Discharge instructions    Complete by:  As directed   Patient will be discharged to home with home health, PT.  She should continue her medications as prescribed. Patient should follow a heart healthy diet. Patient should follow up with her primary care physician within one week of discharge.  Patient should also have a BMP in one week of discharge to monitor her potassium. She should also follow up with neurology within one to 2 months.       Face-to-face encounter (required for Medicare/Medicaid patients)    Complete by:  As directed   I Phyllicia Dudek, [facility] certify that this patient is under my care and that I, or a nurse practitioner or physician's assistant working with me, had a face-to-face encounter that meets the physician face-to-face encounter requirements with this patient on 08/22/2013. The encounter with the patient was in whole, or in part for the following medical condition(s) which is the primary reason for home health care (List medical condition): Acute encephalopathy with abnormal speech likely secondary to seizure  The encounter with the patient was in whole, or in part, for the following medical condition, which is the primary reason for home health care:  Acute encephalopathy likely seizures  I certify that, based on my findings, the following services are medically necessary home health services:  Physical therapy  My clinical findings support the need for the above services:  Unable to leave home safely without assistance and/or assistive device  Further, I certify that my clinical findings support that this patient is homebound due to:  Unable to leave home safely without assistance  Reason for Medically Necessary Home Health Services:  Therapy- Therapeutic Exercises to Increase Strength and Endurance     Home Health    Complete by:  As directed   To provide the following care/treatments:  PT            Medication List         ALPRAZolam 0.5 MG tablet  Commonly known as:  XANAX  Take 1 tablet (0.5 mg total) by mouth daily as needed for anxiety.  citalopram 10 MG tablet  Commonly known as:  CELEXA  Take 1 tablet (10 mg total) by mouth daily.     cyclobenzaprine 10 MG tablet  Commonly known as:  FLEXERIL  Take 10 mg by mouth 3 (three) times daily as needed for muscle spasms.     gabapentin 100 MG capsule  Commonly known as:  NEURONTIN  Take 1 capsule (100 mg total) by mouth 3 (three) times daily.      hydrochlorothiazide 25 MG tablet  Commonly known as:  HYDRODIURIL  Take 25 mg by mouth daily.     hydrOXYzine 25 MG tablet  Commonly known as:  ATARAX/VISTARIL  Take 25 mg by mouth 3 (three) times daily as needed for anxiety, itching, nausea or vomiting.     levETIRAcetam 250 MG tablet  Commonly known as:  KEPPRA  Take 1 tablet (250 mg total) by mouth 2 (two) times daily.     nicotine 21 mg/24hr patch  Commonly known as:  NICODERM CQ - dosed in mg/24 hours  Place 1 patch (21 mg total) onto the skin daily.     omeprazole 20 MG capsule  Commonly known as:  PRILOSEC  Take 20 mg by mouth 2 (two) times daily before a meal.     oxyCODONE 15 MG immediate release tablet  Commonly known as:  ROXICODONE  Take 15 mg by mouth every 4 (four) hours as needed for pain. Max of 4 tablets daily     potassium chloride 10 MEQ tablet  Commonly known as:  K-DUR  Take 1 tablet (10 mEq total) by mouth daily.     prochlorperazine 10 MG tablet  Commonly known as:  COMPAZINE  Take 10 mg by mouth every 8 (eight) hours as needed for nausea or vomiting.     risperiDONE 2 MG tablet  Commonly known as:  RISPERDAL  Take 2 mg by mouth at bedtime.     triamterene-hydrochlorothiazide 37.5-25 MG per tablet  Commonly known as:  MAXZIDE-25  Take 1 tablet by mouth daily.       Allergies  Allergen Reactions  . Tetracyclines & Related Other (See Comments)    Causes pains   Follow-up Information   Follow up with Andrey Spearman, MD. Schedule an appointment as soon as possible for a visit in 2 months. (for hospital follow up, seizure)    Specialties:  Neurology, Radiology   Contact information:   [address] [place] [address] [phone]       Follow up with Primary care physician. Schedule an appointment as soon as possible for a visit in 1 week. [facility] followup)        The results of significant diagnostics from this hospitalization (including imaging, microbiology,  ancillary and laboratory) are listed below for reference.    Significant Diagnostic Studies: Dg Chest 2 View  08/19/2013   CLINICAL DATA:  Chest pain  EXAM: CHEST  2 VIEW  COMPARISON:  None.  FINDINGS: The cardiac shadow is within normal limits. The lungs are clear bilaterally. A calcified granuloma is noted in the right upper lung. No sizable infiltrate or effusion is seen. No bony abnormality is noted.  IMPRESSION: No active cardiopulmonary disease.   Electronically Signed   By: Inez Catalina M.D.   On: 08/19/2013 17:27   Ct Head (brain) Wo Contrast  08/19/2013   CLINICAL DATA:  Altered mental status.  Difficulty with speech.  EXAM: CT HEAD WITHOUT CONTRAST  TECHNIQUE: Contiguous axial images were obtained from the  base of the skull through the vertex without intravenous contrast.  COMPARISON:  01/10/2010.  FINDINGS: No evidence of an acute infarct, acute hemorrhage, mass lesion, mass effect or hydrocephalus. Visualized portions of the paranasal sinuses and mastoid air cells are clear.  IMPRESSION: No acute findings.   Electronically Signed   By: Lorin Picket M.D.   On: 08/19/2013 17:07   Mr Brain Wo Contrast  08/20/2013   CLINICAL DATA:  63 year old female with confusion and abnormal speech. Initial encounter.  EXAM: MRI HEAD WITHOUT CONTRAST  MRA HEAD WITHOUT CONTRAST  TECHNIQUE: Multiplanar, multiecho pulse sequences of the brain and surrounding structures were obtained without intravenous contrast. Angiographic images of the head were obtained using MRA technique without contrast.  COMPARISON:  Head CT without contrast 08/19/2013.  FINDINGS: MRI HEAD FINDINGS  Cerebral volume is within normal limits for age. No restricted diffusion to suggest acute infarction. No midline shift, mass effect, evidence of mass lesion, ventriculomegaly, extra-axial collection or acute intracranial hemorrhage. Cervicomedullary junction and pituitary are within normal limits. Negative visualized cervical spine. Major  intracranial vascular flow voids are preserved.  Intermittent motion artifact on axial images despite repeated imaging attempts. [place] Cables and white matter signal is within normal limits for age throughout the brain.  Visible internal auditory structures appear normal. Visualized paranasal sinuses and mastoids are clear. Visualized orbit soft tissues are within normal limits. Normal bone marrow signal. Incidental hyperostosis of the calvarium. Visualized scalp soft tissues are within normal limits.  MRA HEAD FINDINGS  Antegrade flow in the posterior circulation with mildly dominant distal left vertebral artery. Normal left PICA origins. Normal vertebrobasilar junction. Dominant appearing right AICA. No basilar artery stenosis. Normal SCA and PCA origins. Diminutive right posterior communicating artery, the left is more diminutive or absent. Bilateral PCA branches are within normal limits.  Antegrade flow in both ICA siphons. 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 Associated mild tortuosity and kinked appearance at the skullbase. The right ICA cavernous segment is also asymmetrically tortuous and ectatic. Probable atherosclerotic pseudo lesion along the medial right cavernous segment on series 5, image 74. No ICA siphon stenosis identified. Ophthalmic artery origins are within normal limits. Suspect a small infundibulum at the right ICA terminus directed inferiorly, such as the anterior choroidal artery origin on that side.  At the same time the left ICA is mildly non dominant probably owing to the [facility] anatomy.  Both carotid termini are patent. Normal MCA origins. The left ACA A1 segment is diminutive or absent normal right ACA origin. Mildly tortuous but otherwise negative anterior communicating artery and visualized bilateral ACA branches.  Visualized bilateral MCA branches are within normal limits.  IMPRESSION: 1. Intermittently motion  degraded but negative for age non contrast MRI appearance of the brain. 2. 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 Followup Doppler ultrasound of the cervical carotid arteries or neck CTA/MRA would evaluate further. 3. Otherwise negative intracranial MRA; normal anatomic variation of the ACA anatomy.   Electronically Signed   By: Lars Pinks M.D.   On: 08/20/2013 11:53   Mr Jodene Nam Head/brain Wo Cm  08/20/2013   CLINICAL DATA:  63 year old female with confusion and abnormal speech. Initial encounter.  EXAM: MRI HEAD WITHOUT CONTRAST  MRA HEAD WITHOUT CONTRAST  TECHNIQUE: Multiplanar, multiecho pulse sequences of the brain and surrounding structures  were obtained without intravenous contrast. Angiographic images of the head were obtained using MRA technique without contrast.  COMPARISON:  Head CT without contrast 08/19/2013.  FINDINGS: MRI HEAD FINDINGS  Cerebral volume is within normal limits for age. No restricted diffusion to suggest acute infarction. No midline shift, mass effect, evidence of mass lesion, ventriculomegaly, extra-axial collection or acute intracranial hemorrhage. Cervicomedullary junction and pituitary are within normal limits. Negative visualized cervical spine. Major intracranial vascular flow voids are preserved.  Intermittent motion artifact on axial images despite repeated imaging attempts. [place] Cables and white matter signal is within normal limits for age throughout the brain.  Visible internal auditory structures appear normal. Visualized paranasal sinuses and mastoids are clear. Visualized orbit soft tissues are within normal limits. Normal bone marrow signal. Incidental hyperostosis of the calvarium. Visualized scalp soft tissues are within normal limits.  MRA HEAD FINDINGS  Antegrade flow in the posterior circulation with mildly dominant distal left  vertebral artery. Normal left PICA origins. Normal vertebrobasilar junction. Dominant appearing right AICA. No basilar artery stenosis. Normal SCA and PCA origins. Diminutive right posterior communicating artery, the left is more diminutive or absent. Bilateral PCA branches are within normal limits.  Antegrade flow in both ICA siphons. 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 Associated mild tortuosity and kinked appearance at the skullbase. The right ICA cavernous segment is also asymmetrically tortuous and ectatic. Probable atherosclerotic pseudo lesion along the medial right cavernous segment on series 5, image 74. No ICA siphon stenosis identified. Ophthalmic artery origins are within normal limits. Suspect a small infundibulum at the right ICA terminus directed inferiorly, such as the anterior choroidal artery origin on that side.  At the same time the left ICA is mildly non dominant probably owing to the [facility] anatomy.  Both carotid termini are patent. Normal MCA origins. The left ACA A1 segment is diminutive or absent normal right ACA origin. Mildly tortuous but otherwise negative anterior communicating artery and visualized bilateral ACA branches.  Visualized bilateral MCA branches are within normal limits.  IMPRESSION: 1. Intermittently motion degraded but negative for age non contrast MRI appearance of the brain. 2. 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 Followup Doppler ultrasound of the cervical carotid arteries or neck CTA/MRA would evaluate further. 3. Otherwise negative intracranial MRA; normal anatomic variation of the ACA anatomy.   Electronically Signed   By: Lars Pinks M.D.   On: 08/20/2013 11:53    Microbiology: No results found for this or any previous visit (from the  past 240 hour(s)).   Labs: Basic Metabolic Panel:  Recent Labs Lab 08/19/13 1549 08/19/13 1620 08/20/13 0930 08/21/13 0350 08/22/13 0740  NA 137 136*  --  134* 136*  K 3.4* 3.1*  --  3.3* 3.8  CL 93* 94*  --  93* 97  CO2 23  --   --  23 24  GLUCOSE 142* 150*  --  118* 129*  BUN 13 12  --  12 10  CREATININE 0.99 1.10  --  1.05 1.04  CALCIUM 10.2  --   --  9.4 10.1  MG  --   --  1.9  --   --    Liver Function Tests:  Recent Labs Lab 08/19/13 1549  AST 17  ALT 13  ALKPHOS 119*  BILITOT 0.5  PROT  8.3  ALBUMIN 4.2   No results found for this basename: LIPASE, AMYLASE,  in the last 168 hours No results found for this basename: AMMONIA,  in the last 168 hours CBC:  Recent Labs Lab 08/19/13 1549 08/19/13 1620  WBC 10.1  --   NEUTROABS 7.7  --   HGB 15.8* 17.3*  HCT 43.6 51.0*  MCV 82.4  --   PLT 406*  --    Cardiac Enzymes:  Recent Labs Lab 08/19/13 2319 08/20/13 0309 08/20/13 0930  TROPONINI <0.30 <0.30 <0.30   BNP: BNP (last 3 results) No results found for this basename: PROBNP,  in the last 8760 hours CBG:  Recent Labs Lab 08/19/13 1557  GLUCAP 147*    Signed:  Ahmar Pickrell  [facility] 08/22/2013, 10:14 AM

## 2013-08-23 NOTE — Care Management Note (Addendum)
    Page 1 of 2   08/23/2013     8:54:24 AM CARE MANAGEMENT NOTE 08/23/2013  Patient:  [name],[name]   Account Number:  [id number]  Date Initiated:  08/22/2013  Documentation initiated by:  [facility]  Subjective/Objective Assessment:   adm: AMS/CVA     Action/Plan:   discharge planning   Anticipated DC Date:  08/22/2013   Anticipated DC Plan:        [place]  CM consult      [facility] Choice  HOME HEALTH   Choice offered to / List presented to:     DME arranged  CANE        HH arranged  HH-2 PT      [place] agency  OTHER - SEE NOTE   Status of service:  Completed, signed off Medicare Important Message given?  YES (If response is "NO", the following Medicare IM given date fields will be blank) Date Medicare IM given:  08/20/2013 Medicare IM given by:   Date Additional Medicare IM given:   Additional Medicare IM given by:    Discharge Disposition:  [place]  Per UR Regulation:    If discussed at Long Length of Stay Meetings, dates discussed:    Comments:  08/24/1506:50 CM received call back and faxed facesheet, order, F2F, H&P, DC summary to [phone] per Intake request at [facility].  No other CM needs were communicated.  Mariane Masters, BSN, CM [phone].  LATE ENTRY: CM met with pt and daughter, [name] [name] in room to offer choice.  Daughter, [name] [name] states she is taking her mother home with her [address] [place], [place] [address] and wishes to have HHPT through [facility].  [name] [name] can be reached at [phone]. [name] [name] and pt verbalize understanding arrangements my have to be made on Monday.  DME called to deliver cane to room prior to discharge.  CM called [facility] [phone] and then [phone] to request HHPT and am waiting for callback.  Mariane Masters, BSN, [place].

## 2013-08-28 ENCOUNTER — Telehealth: Payer: Self-pay | Admitting: Diagnostic Neuroimaging

## 2013-08-28 NOTE — Telephone Encounter (Signed)
Patient requesting an order for home health aid.  Please call anytime and leave message if not available.

## 2013-09-03 NOTE — Telephone Encounter (Signed)
Refer to PCP. -VRP

## 2013-09-07 NOTE — Telephone Encounter (Signed)
Patient calling again to check on the status of her order for a home health aid, please return call and advise.

## 2013-09-08 NOTE — Telephone Encounter (Signed)
Called patient to inform our office did not place home health aid referral and she should follow up with PCP. Unable to reach.

## 2013-09-08 NOTE — Telephone Encounter (Signed)
Patient walked into the the lobby at 12:29 and the below information was relayed to her. She was agreeable to following up with her PCP.

## 2014-02-04 ENCOUNTER — Ambulatory Visit: Payer: Medicare Other | Admitting: Diagnostic Neuroimaging

## 2015-09-20 ENCOUNTER — Encounter: Payer: Self-pay | Admitting: Gastroenterology

## 2015-10-25 ENCOUNTER — Encounter: Payer: Self-pay | Admitting: Nurse Practitioner

## 2015-11-02 ENCOUNTER — Ambulatory Visit: Payer: Medicare Other | Admitting: *Deleted

## 2015-11-02 VITALS — Ht 62.0 in | Wt 165.2 lb

## 2015-11-02 DIAGNOSIS — Z1211 Encounter for screening for malignant neoplasm of colon: Secondary | ICD-10-CM

## 2015-11-02 MED ORDER — SUPREP BOWEL PREP KIT 17.5-3.13-1.6 GM/177ML PO SOLN: 1.0000 | Freq: Once | ORAL | 0 refills | Status: AC

## 2015-11-02 NOTE — Progress Notes (Signed)
Patient denies any allergies to egg or soy products. Patient denies complications with anesthesia/sedation.  Patient denies oxygen use at home and denies diet medications. Emmi instructions for colonoscopy explained but patient denied.  Pamphlet given.  

## 2015-11-03 ENCOUNTER — Telehealth: Payer: Self-pay | Admitting: Gastroenterology

## 2015-11-03 NOTE — Telephone Encounter (Signed)
Reviewed Miralax instructions with pt and mailed her a copy                                                                          Angela/PV

## 2015-11-04 ENCOUNTER — Encounter: Payer: Self-pay | Admitting: Gastroenterology

## 2015-11-14 ENCOUNTER — Encounter: Payer: Self-pay | Admitting: Gastroenterology

## 2015-11-17 ENCOUNTER — Encounter: Payer: Self-pay | Admitting: Gastroenterology

## 2015-11-27 ENCOUNTER — Encounter (HOSPITAL_COMMUNITY): Payer: Self-pay

## 2015-11-27 ENCOUNTER — Emergency Department (HOSPITAL_COMMUNITY): Payer: Medicare Other

## 2015-11-27 ENCOUNTER — Observation Stay (HOSPITAL_COMMUNITY)
Admission: EM | Admit: 2015-11-27 | Discharge: 2015-11-28 | Disposition: A | Discharge status: Home / Self Care | Payer: Medicare Other | Attending: Internal Medicine | Admitting: Internal Medicine

## 2015-11-27 DIAGNOSIS — Z8673 Personal history of transient ischemic attack (TIA), and cerebral infarction without residual deficits: Secondary | ICD-10-CM | POA: Insufficient documentation

## 2015-11-27 DIAGNOSIS — G8929 Other chronic pain: Secondary | ICD-10-CM | POA: Diagnosis not present

## 2015-11-27 DIAGNOSIS — E871 Hypo-osmolality and hyponatremia: Secondary | ICD-10-CM | POA: Diagnosis not present

## 2015-11-27 DIAGNOSIS — Y998 Other external cause status: Secondary | ICD-10-CM | POA: Insufficient documentation

## 2015-11-27 DIAGNOSIS — M797 Fibromyalgia: Secondary | ICD-10-CM | POA: Diagnosis not present

## 2015-11-27 DIAGNOSIS — E876 Hypokalemia: Secondary | ICD-10-CM | POA: Diagnosis not present

## 2015-11-27 DIAGNOSIS — F419 Anxiety disorder, unspecified: Secondary | ICD-10-CM | POA: Diagnosis not present

## 2015-11-27 DIAGNOSIS — R590 Localized enlarged lymph nodes: Secondary | ICD-10-CM | POA: Insufficient documentation

## 2015-11-27 DIAGNOSIS — K838 Other specified diseases of biliary tract: Secondary | ICD-10-CM | POA: Insufficient documentation

## 2015-11-27 DIAGNOSIS — R296 Repeated falls: Secondary | ICD-10-CM | POA: Diagnosis not present

## 2015-11-27 DIAGNOSIS — I700 Atherosclerosis of aorta: Secondary | ICD-10-CM | POA: Diagnosis not present

## 2015-11-27 DIAGNOSIS — W010XXA Fall on same level from slipping, tripping and stumbling without subsequent striking against object, initial encounter: Secondary | ICD-10-CM | POA: Diagnosis not present

## 2015-11-27 DIAGNOSIS — Y9389 Activity, other specified: Secondary | ICD-10-CM | POA: Insufficient documentation

## 2015-11-27 DIAGNOSIS — R509 Fever, unspecified: Secondary | ICD-10-CM | POA: Insufficient documentation

## 2015-11-27 DIAGNOSIS — M4802 Spinal stenosis, cervical region: Secondary | ICD-10-CM | POA: Insufficient documentation

## 2015-11-27 DIAGNOSIS — R05 Cough: Secondary | ICD-10-CM | POA: Insufficient documentation

## 2015-11-27 DIAGNOSIS — M47816 Spondylosis without myelopathy or radiculopathy, lumbar region: Secondary | ICD-10-CM | POA: Insufficient documentation

## 2015-11-27 DIAGNOSIS — W19XXXA Unspecified fall, initial encounter: Secondary | ICD-10-CM

## 2015-11-27 DIAGNOSIS — R200 Anesthesia of skin: Secondary | ICD-10-CM | POA: Diagnosis not present

## 2015-11-27 DIAGNOSIS — M19042 Primary osteoarthritis, left hand: Secondary | ICD-10-CM | POA: Insufficient documentation

## 2015-11-27 DIAGNOSIS — M48061 Spinal stenosis, lumbar region without neurogenic claudication: Secondary | ICD-10-CM | POA: Diagnosis not present

## 2015-11-27 DIAGNOSIS — Y92009 Unspecified place in unspecified non-institutional (private) residence as the place of occurrence of the external cause: Secondary | ICD-10-CM | POA: Diagnosis not present

## 2015-11-27 DIAGNOSIS — I10 Essential (primary) hypertension: Secondary | ICD-10-CM | POA: Diagnosis not present

## 2015-11-27 DIAGNOSIS — R4182 Altered mental status, unspecified: Secondary | ICD-10-CM | POA: Diagnosis not present

## 2015-11-27 DIAGNOSIS — F1210 Cannabis abuse, uncomplicated: Secondary | ICD-10-CM | POA: Insufficient documentation

## 2015-11-27 DIAGNOSIS — R569 Unspecified convulsions: Secondary | ICD-10-CM | POA: Diagnosis not present

## 2015-11-27 DIAGNOSIS — M5126 Other intervertebral disc displacement, lumbar region: Secondary | ICD-10-CM | POA: Diagnosis not present

## 2015-11-27 DIAGNOSIS — J8410 Pulmonary fibrosis, unspecified: Secondary | ICD-10-CM | POA: Insufficient documentation

## 2015-11-27 DIAGNOSIS — F329 Major depressive disorder, single episode, unspecified: Secondary | ICD-10-CM | POA: Diagnosis not present

## 2015-11-27 DIAGNOSIS — G4733 Obstructive sleep apnea (adult) (pediatric): Secondary | ICD-10-CM | POA: Diagnosis not present

## 2015-11-27 DIAGNOSIS — M5136 Other intervertebral disc degeneration, lumbar region: Secondary | ICD-10-CM | POA: Insufficient documentation

## 2015-11-27 DIAGNOSIS — R51 Headache: Secondary | ICD-10-CM | POA: Insufficient documentation

## 2015-11-27 DIAGNOSIS — R531 Weakness: Principal | ICD-10-CM | POA: Insufficient documentation

## 2015-11-27 DIAGNOSIS — R29898 Other symptoms and signs involving the musculoskeletal system: Secondary | ICD-10-CM

## 2015-11-27 DIAGNOSIS — M19041 Primary osteoarthritis, right hand: Secondary | ICD-10-CM | POA: Insufficient documentation

## 2015-11-27 DIAGNOSIS — K219 Gastro-esophageal reflux disease without esophagitis: Secondary | ICD-10-CM | POA: Insufficient documentation

## 2015-11-27 DIAGNOSIS — E878 Other disorders of electrolyte and fluid balance, not elsewhere classified: Secondary | ICD-10-CM | POA: Diagnosis not present

## 2015-11-27 DIAGNOSIS — F17210 Nicotine dependence, cigarettes, uncomplicated: Secondary | ICD-10-CM | POA: Insufficient documentation

## 2015-11-27 DIAGNOSIS — F1290 Cannabis use, unspecified, uncomplicated: Secondary | ICD-10-CM | POA: Diagnosis present

## 2015-11-27 DIAGNOSIS — Z881 Allergy status to other antibiotic agents status: Secondary | ICD-10-CM | POA: Insufficient documentation

## 2015-11-27 LAB — BASIC METABOLIC PANEL
Anion gap: 11 (ref 5–15)
BUN: 9 mg/dL (ref 6–20)
CHLORIDE: 92 mmol/L — AB (ref 101–111)
CO2: 27 mmol/L (ref 22–32)
CREATININE: 0.97 mg/dL (ref 0.44–1.00)
Calcium: 10 mg/dL (ref 8.9–10.3)
GFR calc Af Amer: >60 mL/min (ref >60)
GFR calc non Af Amer: 60 mL/min — ABNORMAL LOW (ref >60)
GLUCOSE: 119 mg/dL — AB (ref 65–99)
POTASSIUM: 3.2 mmol/L — AB (ref 3.5–5.1)
Sodium: 130 mmol/L — ABNORMAL LOW (ref 135–145)

## 2015-11-27 LAB — I-STAT TROPONIN, ED: Troponin i, poc: 0.01 ng/mL (ref 0.00–0.08)

## 2015-11-27 LAB — CBC
HEMATOCRIT: 39.4 % (ref 36.0–46.0)
Hemoglobin: 14.1 g/dL (ref 12.0–15.0)
MCH: 30.4 pg (ref 26.0–34.0)
MCHC: 35.8 g/dL (ref 30.0–36.0)
MCV: 84.9 fL (ref 78.0–100.0)
Platelets: 376 10*3/uL (ref 150–400)
RBC: 4.64 MIL/uL (ref 3.87–5.11)
RDW: 13.1 % (ref 11.5–15.5)
WBC: 9.4 10*3/uL (ref 4.0–10.5)

## 2015-11-27 LAB — URINALYSIS, ROUTINE W REFLEX MICROSCOPIC
BILIRUBIN URINE: NEGATIVE
Glucose, UA: NEGATIVE mg/dL
HGB URINE DIPSTICK: NEGATIVE
Ketones, ur: NEGATIVE mg/dL
LEUKOCYTES UA: NEGATIVE
Nitrite: NEGATIVE
PH: 7 (ref 5.0–8.0)
PROTEIN: NEGATIVE mg/dL
Specific Gravity, Urine: 1.009 (ref 1.005–1.030)

## 2015-11-27 LAB — CBG MONITORING, ED
Glucose-Capillary: 149 mg/dL — ABNORMAL HIGH (ref 65–99)
Glucose-Capillary: 140 mg/dL — ABNORMAL HIGH (ref 65–99)

## 2015-11-27 LAB — HEPATIC FUNCTION PANEL
ALBUMIN: 3.6 g/dL (ref 3.5–5.0)
ALK PHOS: 108 U/L (ref 38–126)
ALT: 18 U/L (ref 14–54)
AST: 26 U/L (ref 15–41)
BILIRUBIN TOTAL: 0.4 mg/dL (ref 0.3–1.2)
Bilirubin, Direct: <0.1 mg/dL — ABNORMAL LOW (ref 0.1–0.5)
TOTAL PROTEIN: 6.5 g/dL (ref 6.5–8.1)

## 2015-11-27 LAB — RAPID URINE DRUG SCREEN, HOSP PERFORMED
AMPHETAMINES: NOT DETECTED
BENZODIAZEPINES: POSITIVE — AB
Barbiturates: NOT DETECTED
Cocaine: NOT DETECTED
Opiates: NOT DETECTED
TETRAHYDROCANNABINOL: POSITIVE — AB

## 2015-11-27 LAB — PROTIME-INR
INR: 0.96
PROTHROMBIN TIME: 12.8 s (ref 11.4–15.2)

## 2015-11-27 LAB — APTT: aPTT: 38 seconds — ABNORMAL HIGH (ref 24–36)

## 2015-11-27 MED ORDER — LEVETIRACETAM 250 MG PO TABS: 250.0000 mg | ORAL_TABLET | Freq: Two times a day (BID) | ORAL | Status: DC

## 2015-11-27 MED ORDER — ACETAMINOPHEN 650 MG RE SUPP: 650.0000 mg | Freq: Four times a day (QID) | RECTAL | Status: DC | PRN

## 2015-11-27 MED ORDER — ASPIRIN EC 81 MG PO TBEC
81.0000 mg | DELAYED_RELEASE_TABLET | Freq: Every day | ORAL | Status: DC
  Filled 2015-11-27: qty 1

## 2015-11-27 MED ORDER — POTASSIUM CHLORIDE 20 MEQ/15ML (10%) PO SOLN: 40.0000 meq | Freq: Once | ORAL | Status: DC

## 2015-11-27 MED ORDER — ONDANSETRON HCL 4 MG/2ML IJ SOLN: 4.0000 mg | Freq: Four times a day (QID) | INTRAMUSCULAR | Status: DC | PRN

## 2015-11-27 MED ORDER — ACETAMINOPHEN 325 MG PO TABS: 650.0000 mg | ORAL_TABLET | Freq: Four times a day (QID) | ORAL | Status: DC | PRN

## 2015-11-27 MED ORDER — SODIUM CHLORIDE 0.9% FLUSH: 3.0000 mL | Freq: Two times a day (BID) | INTRAVENOUS | Status: DC

## 2015-11-27 MED ORDER — OXYCODONE HCL 5 MG PO TABS
5.0000 mg | ORAL_TABLET | Freq: Four times a day (QID) | ORAL | Status: DC | PRN
  Administered 2015-11-28 (×2): 5 mg via ORAL
  Filled 2015-11-27 (×3): qty 1

## 2015-11-27 MED ORDER — SODIUM CHLORIDE 0.9 % IV SOLN
250.0000 mg | Freq: Two times a day (BID) | INTRAVENOUS | Status: DC
  Administered 2015-11-28 (×2): 250 mg via INTRAVENOUS
  Filled 2015-11-27 (×4): qty 2.5

## 2015-11-27 MED ORDER — SODIUM CHLORIDE 0.9 % IV SOLN
INTRAVENOUS | Status: DC
  Administered 2015-11-27: via INTRAVENOUS

## 2015-11-27 MED ORDER — SODIUM CHLORIDE 0.9 % IV BOLUS (SEPSIS)
500.0000 mL | Freq: Once | INTRAVENOUS | Status: AC
  Administered 2015-11-27: 500 mL via INTRAVENOUS

## 2015-11-27 MED ORDER — ONDANSETRON HCL 4 MG PO TABS: 4.0000 mg | ORAL_TABLET | Freq: Four times a day (QID) | ORAL | Status: DC | PRN

## 2015-11-27 MED ORDER — CITALOPRAM HYDROBROMIDE 20 MG PO TABS
20.0000 mg | ORAL_TABLET | Freq: Every day | ORAL | Status: DC
  Administered 2015-11-28: 20 mg via ORAL
  Filled 2015-11-27: qty 1

## 2015-11-27 MED ORDER — PANTOPRAZOLE SODIUM 40 MG PO TBEC
40.0000 mg | DELAYED_RELEASE_TABLET | Freq: Every day | ORAL | Status: DC
  Administered 2015-11-28: 40 mg via ORAL
  Filled 2015-11-27: qty 1

## 2015-11-27 MED ORDER — ENOXAPARIN SODIUM 40 MG/0.4ML ~~LOC~~ SOLN
40.0000 mg | SUBCUTANEOUS | Status: DC
  Administered 2015-11-28: 40 mg via SUBCUTANEOUS
  Filled 2015-11-27: qty 0.4

## 2015-11-27 MED ORDER — ALPRAZOLAM 0.25 MG PO TABS
0.2500 mg | ORAL_TABLET | Freq: Three times a day (TID) | ORAL | Status: DC | PRN
  Administered 2015-11-28: 0.25 mg via ORAL
  Filled 2015-11-27 (×2): qty 1

## 2015-11-27 NOTE — ED Notes (Signed)
Pt's SpO2 levels hovering around 92-94% levels. Placed pt on 2L O2 via [place] as supportive measures, as well as hoping it would improve her alertness levels.

## 2015-11-27 NOTE — H&P (Signed)
History and Physical    [name] [name] [id number] DOB: 1950/06/10 DOA: 11/27/2015  Referring MD/NP/PA:   PCP: Bartholome Bill, MD   Patient coming from:  The patient is coming from home.  At baseline, pt is independent for most of ADL.   Chief Complaint: Frequent fall, generalized weakness  HPI: [name] [name] is a 65 y.o. female with medical history significant of hypertension, GERD, depression, seizure, stroke, marijuana abuse, tobacco abuse, fibromyalgia, OSA not on CPAP, who presents with frequent fall and generalized weakness.  Patient states that she generalized weakness, particularly in both legs. She states that fell at least 5 times in the past 24 hours, no head or neck injury. No LOC. She has poor hearing, which is not new. No vision change. Per EDP, patient seemed to have mild slurred words, but it seems to me that pt just mumbles when talking. When asked to speak loudly, she does not have slurred speech. Patient has nausea, but no vomiting, diarrhea, abdominal pain, symptoms of UTI. No chest pain, cough, shortness of breath, fever or chills. No incontinence of bowel movement or urination. Of note, pt has fibromyalgia and chronic pain issues that she takes xanax and oxycodone. Per ED RN, family reports pt having "taken extra" xanax & pain meds recently d/t incr'd pain.   ED Course: pt was found to have  WBC 9.4, INR 0.6, PTT 38, positive UDS for Benzo and THC, negative urinalysis, potassium 3.2, sodium 130, creatinine normal, temperature normal, O2 saturation 92% on room air, which improved to 100% on 2 L oxygen, negative chest x-ray, negative CT head, negative CT of C-spine for acute abnormalities. CT of lumbar spine showed degenerative disc disease without acute issues. Pt is placed on telemetry bed for observation.   Review of Systems:   General: no fevers, chills, no changes in body weight, has poor appetite, has fatigue HEENT: no blurry vision, has hearing loss, no sore  throat Respiratory: no dyspnea, coughing, wheezing CV: no chest pain, no palpitations GI: has nausea, no vomiting, abdominal pain, diarrhea, constipation GU: no dysuria, burning on urination, increased urinary frequency, hematuria  Ext: no leg edema Neuro: no unilateral weakness, numbness, or tingling, no vision change or hearing loss. Has bilateral leg weakness  Skin: no rash, no skin tear. MSK: No muscle spasm, no deformity, no limitation of range of movement in spin Heme: No easy bruising.  Travel history: No recent long distant travel.  Allergy:  Allergies  Allergen Reactions  . Tetracyclines & Related Other (See Comments)    Causes pains    Past Medical History:  Diagnosis Date  . Allergy   . Anxiety   . Arthritis    lower back, hands  . Depression   . Fibromyalgia   . Fibromyalgia   . GERD (gastroesophageal reflux disease)   . Hx of bronchitis   . Hypertension   . Osteoporosis   . Seizures ([place])    last one 05/2015 - on Keppra   . Sleep apnea    Does not use CPAP  . Stroke [facility]) 2015   some aphasia  . Substance abuse     Past Surgical History:  Procedure Laterality Date  . BREAST LUMPECTOMY Right    benign  . CHOLECYSTECTOMY    . TONSILLECTOMY    . WISDOM TOOTH EXTRACTION      Social History:  reports that she has been smoking Cigarettes.  She has a 12.50 pack-year smoking history. She has never used smokeless tobacco. She  reports that she uses drugs, including Marijuana. She reports that she does not drink alcohol.  Family History:  Family History  Problem Relation Age of Onset  . Alzheimer's disease Mother   . Stroke Father   . Esophageal cancer Maternal Aunt   . Colon cancer Neg Hx   . Rectal cancer Neg Hx   . Stomach cancer Neg Hx      Prior to Admission medications   Medication Sig Start Date End Date Taking? Authorizing Provider  ALPRAZolam Duanne Moron) 1 MG tablet Take 0.5 mg by mouth 3 (three) times daily as needed for anxiety.   Yes Historical  Provider, MD  citalopram (CELEXA) 20 MG tablet Take 20 mg by mouth daily.   Yes Historical Provider, MD  levETIRAcetam (KEPPRA) 250 MG tablet Take 1 tablet (250 mg total) by mouth 2 (two) times daily. 08/22/13  Yes Maryann Mikhail, DO  omeprazole (PRILOSEC) 40 MG capsule Take 40 mg by mouth daily.   Yes Historical Provider, MD  oxyCODONE (ROXICODONE) 15 MG immediate release tablet Take 15 mg by mouth every 8 (eight) hours as needed for pain. Max of 4 tablets daily   Yes Historical Provider, MD  ALPRAZolam (XANAX) 0.5 MG tablet Take 1 tablet (0.5 mg total) by mouth daily as needed for anxiety. Patient not taking: Reported on 11/27/2015 08/22/13   Velta Addison Mikhail, DO  citalopram (CELEXA) 10 MG tablet Take 1 tablet (10 mg total) by mouth daily. Patient not taking: Reported on 11/27/2015 08/22/13   Velta Addison Mikhail, DO  gabapentin (NEURONTIN) 100 MG capsule Take 1 capsule (100 mg total) by mouth 3 (three) times daily. Patient not taking: Reported on 11/27/2015 08/22/13   Velta Addison Mikhail, DO  potassium chloride (K-DUR) 10 MEQ tablet Take 1 tablet (10 mEq total) by mouth daily. Patient not taking: Reported on 11/27/2015 08/22/13   Cristal Ford, DO    Physical Exam: Vitals:   11/27/15 2230 11/27/15 2300 11/27/15 2330 11/28/15 0018  BP: 136/82 123/59 131/82 139/73  Pulse: 81 81 82 76  Resp: 15 15 14 15   Temp:    97.7 F (36.5 C)  TempSrc:    Oral  SpO2: 100% 95% 95% 91%  Weight:      Height:       General: Not in acute distress. Dry mucus and membrane.  HEENT:       Eyes: PERRL, EOMI, no scleral icterus.       ENT: No discharge from the ears and nose, no pharynx injection, no tonsillar enlargement.        Neck: No JVD, no bruit, no mass felt. Heme: No neck lymph node enlargement. Cardiac: S1/S2, RRR, No murmurs, No gallops or rubs. Respiratory: No rales, wheezing, rhonchi or rubs. GI: Soft, nondistended, nontender, no rebound pain, no organomegaly, BS present. GU: No hematuria Ext: No  pitting leg edema bilaterally. 2+DP/PT pulse bilaterally. Musculoskeletal: No joint deformities, No joint redness or warmth, no limitation of ROM in spin. Skin: No rashes.  Neuro: drowsy, but not oriented X3, cranial nerves II-XII grossly intact, moves all extremities normally. Muscle strength 2/5 in both legs and 4/5 in arms. Sensation to light touch intact. Brachial reflex 2+ bilaterally. Negative Babinski's sign.  Psych: Patient is not psychotic, no suicidal or hemocidal ideation.  Labs on Admission: I have personally reviewed following labs and imaging studies  CBC:  Recent Labs Lab 11/27/15 1137  WBC 9.4  HGB 14.1  HCT 39.4  MCV 84.9  PLT [id number]   Basic Metabolic Panel:  Recent Labs Lab 11/27/15 1137 11/27/15 2335  NA 130* 133*  K 3.2* 2.9*  CL 92* 96*  CO2 27 28  GLUCOSE 119* 110*  BUN 9 9  CREATININE 0.97 0.88  CALCIUM 10.0 9.2   GFR: Estimated Creatinine Clearance: 61.3 mL/min (by C-G formula based on SCr of 0.88 mg/dL). Liver Function Tests:  Recent Labs Lab 11/27/15 1854  AST 26  ALT 18  ALKPHOS 108  BILITOT 0.4  PROT 6.5  ALBUMIN 3.6   No results for input(s): LIPASE, AMYLASE in the last 168 hours. No results for input(s): AMMONIA in the last 168 hours. Coagulation Profile:  Recent Labs Lab 11/27/15 1854  INR 0.96   Cardiac Enzymes: No results for input(s): CKTOTAL, CKMB, CKMBINDEX, TROPONINI in the last 168 hours. BNP (last 3 results) No results for input(s): PROBNP in the last 8760 hours. HbA1C: No results for input(s): HGBA1C in the last 72 hours. CBG:  Recent Labs Lab 11/27/15 1534 11/27/15 1637  GLUCAP 149* 140*   Lipid Profile: No results for input(s): CHOL, HDL, LDLCALC, TRIG, CHOLHDL, LDLDIRECT in the last 72 hours. Thyroid Function Tests: No results for input(s): TSH, T4TOTAL, FREET4, T3FREE, THYROIDAB in the last 72 hours. Anemia Panel: No results for input(s): VITAMINB12, FOLATE, FERRITIN, TIBC, IRON, RETICCTPCT in the  last 72 hours. Urine analysis:    Component Value Date/Time   COLORURINE YELLOW 11/27/2015 [place] 11/27/2015 1743   LABSPEC 1.009 11/27/2015 1743   PHURINE 7.0 11/27/2015 1743   GLUCOSEU NEGATIVE 11/27/2015 1743   HGBUR NEGATIVE 11/27/2015 1743   BILIRUBINUR NEGATIVE 11/27/2015 1743   KETONESUR NEGATIVE 11/27/2015 1743   PROTEINUR NEGATIVE 11/27/2015 1743   UROBILINOGEN 1.0 08/19/2013 1650   NITRITE NEGATIVE 11/27/2015 1743   LEUKOCYTESUR NEGATIVE 11/27/2015 1743   Sepsis Labs: @LABRCNTIP (procalcitonin:4,lacticidven:4) )No results found for this or any previous visit (from the past 240 hour(s)).   Radiological Exams on Admission: Dg Chest 2 View  Result Date: 11/27/2015 CLINICAL DATA:  Cough, fever and headache today. EXAM: CHEST  2 VIEW COMPARISON:  PA and lateral chest 08/19/2013. FINDINGS: Lung volumes are low but the lungs are clear with a calcified right upper lobe granuloma noted. No pneumothorax or pleural effusion. Large colonic stool burden noted. IMPRESSION: No acute disease. Large colonic stool burden. Electronically Signed   By: Inge Rise M.D.   On: 11/27/2015 17:20   Ct Head Wo Contrast  Result Date: 11/27/2015 CLINICAL DATA:  Acute mental status change.  Lethargic. EXAM: CT HEAD WITHOUT CONTRAST TECHNIQUE: Contiguous axial images were obtained from the base of the skull through the vertex without intravenous contrast. COMPARISON:  August 19, 2013 FINDINGS: Brain: No evidence of acute infarction, hemorrhage, hydrocephalus, extra-axial collection or mass lesion/mass effect. Vascular: No hyperdense vessel or unexpected calcification. Skull: Normal. Negative for fracture or focal lesion. Sinuses/Orbits: No acute finding. Other: None. IMPRESSION: No acute intracranial abnormality. Electronically Signed   By: Dorise Bullion III M.D   On: 11/27/2015 18:07   Ct Cervical Spine Wo Contrast  Result Date: 11/27/2015 CLINICAL DATA:  65 year old female  with multiple recent falls and increasing weakness for 3-4 days. Altered mental status. Initial encounter. EXAM: CT CERVICAL SPINE WITHOUT CONTRAST TECHNIQUE: Multidetector CT imaging of the cervical spine was performed without intravenous contrast. Multiplanar CT image reconstructions were also generated. COMPARISON:  Head CT without contrast 1746 hours today. Brain MRI 08/20/2013. FINDINGS: Alignment: Mild straightening of lower cervical lordosis. Cervicothoracic junction alignment is within normal limits. Bilateral posterior element  alignment is within normal limits. Skull base and vertebrae: Visualized skull base is intact. No atlanto-occipital dissociation. No acute osseous abnormality identified. Soft tissues and spinal canal: Negative visualized brain parenchyma. Calcified atherosclerosis at the skull base. Negative visualized noncontrast neck soft tissues. Disc levels: Negative for age aside from mild to moderate multilevel right side cervical facet hypertrophy, most pronounced at C4-C5 (sagittal image 24). No CT evidence of cervical spinal stenosis. Up to mild right C4 and C6 neural foraminal stenosis, mild to moderate right C5 neural foraminal stenosis. Upper chest: Mild scarring, atelectasis, and left apex paraseptal emphysema. Partially visible calcified aortic arch atherosclerosis. Visualized upper thoracic levels appear intact. IMPRESSION: 1.  No acute osseous abnormality in the cervical spine. 2. No CT evidence of cervical spinal stenosis and overall mild for age cervical spine degeneration. There is up to moderate right side facet arthropathy, and associated right C5 neural foraminal stenosis. Electronically Signed   By: Genevie Ann M.D.   On: 11/27/2015 19:52   Ct Lumbar Spine Wo Contrast  Result Date: 11/27/2015 CLINICAL DATA:  65 year old female with multiple recent falls and increasing weakness for 3-4 days. Altered mental status. Initial encounter. EXAM: CT LUMBAR SPINE WITHOUT CONTRAST  TECHNIQUE: Multidetector CT imaging of the lumbar spine was performed without intravenous contrast administration. Multiplanar CT image reconstructions were also generated. COMPARISON:  Head CT without contrast 1746 hours today. FINDINGS: Segmentation: Normal. Alignment: Normal. Vertebrae: No acute osseous abnormality identified. Visible lower thoracic levels appear intact with degenerative appearing anterior superior endplate irregularity and sclerosis at T12. Lumbar facet arthropathy, detailed below. Visible sacrum and SI joints intact. Paraspinal and other soft tissues: Calcified aortic atherosclerosis. Partially visible cholecystectomy clips and enlargement of the CBD which may simply be postoperative in nature. Negative noncontrast kidneys. Partially visible urinary bladder appears mildly distended. Diverticulosis of the sigmoid colon is partially visible. Retained stool in the colon. Negative visualized posterior paraspinal soft tissues. Disc levels: T11-T12: Vacuum disc. Circumferential disc bulge and endplate spurring. T12-L1:  Mild vacuum disc.  Minimal disc bulge. L1-L2:  Minimal disc bulge. L2-L3:  Minimal disc bulge.  Mild facet hypertrophy. L3-L4: Mild disc space loss. Mild to moderate circumferential disc bulge. Moderate to severe facet hypertrophy. Mild ligament flavum hypertrophy. Mild to moderate spinal stenosis (series 4, image 64). Borderline to mild L3 foraminal stenosis. L4-L5: Mild circumferential disc bulge. Moderate facet hypertrophy. Mild ligament flavum hypertrophy. No significant spinal stenosis. Borderline to mild L4 foraminal stenosis. L5-S1: Mild circumferential disc bulge. Severe facet hypertrophy. Mild to moderate ligament flavum hypertrophy greater on the left. No spinal stenosis. No convincing L5 foraminal stenosis. IMPRESSION: 1.  No acute osseous abnormality. 2. Lower thoracic disc and endplate degeneration, and moderate to severe lumbar facet degeneration from L3-L4 to L5-S1.  Multifactorial mild to moderate spinal stenosis at L3-L4. Up to mild L3 and L4 neural foraminal stenosis. 3.  Calcified aortic atherosclerosis. Electronically Signed   By: Genevie Ann M.D.   On: 11/27/2015 19:47     EKG: Independently reviewed. Sinus rhythm, QTC 447, LVH, nonspecific T-wave change.  Assessment/Plan Principal Problem:   Fall Active Problems:   Essential hypertension, benign   Hypokalemia   Marijuana use   Fibromyalgia   Generalized weakness   Hyponatremia   Fall and generalized weakness: Etiology is not clear. CT head, CT C-spine, CT lumbar spine did not show acute issues. This could be due to multiple etiology, including side effects of pain medication and Xanax, dehydration, electrolyte disturbance and drug abuse. Patient has  severe bilateral leg weakness, will need to rule out other possibility, such as stroke and spinal cord compression.   -will place on tele bed for obs -get MRI of brain and MRI-lumbar spin -Check orthostatic vital signs -PT/OT -decreased dose of pain medication and Xanax -Start aspirin (patient had history of stroke, but not taking aspirin)  HTN: Not on medications. Blood pressure 136/82 -Monitor blood pressures closely  Hypokalemia: K= 3.2 on admission. - Repleted - Check Mg level  Fibromyalgia: -Decreased oxycodone dose from 15 mg 3 times a day to 5 mg every 6 hours  Depression and anxiety: Stable, no suicidal or homicidal ideations. -Continue home medications: Celexa -Decreased Xanax dose from 0.5 mg to 0.25 mg twice a day when necessary  GERD: -Protonix  Seizure: Not sure if patient is compliant to medication. -switch oral Keppra to IV 250 mg twice a day -Check Keppra level -Seizure precaution -Check EEG due to fall  Hyponatremia: Most likely due to decreased oral intake. Side effect of pysch meds and thyroid dysfunction are also possible.  - Will check urine sodium, urine osmolality, serum osmolality. - check TSH - IVF: 500  cc NS in ED, will give another 500 cc of NS, then 75 cc/h  - check BMP q6h  Hx of stroke: -start ASA   DVT ppx: SQ Lovenox Code Status: Full code Family Communication: None at bed side. Disposition Plan:  Anticipate discharge back to previous home environment Consults called:  none Admission status: Obs / tele    Date of Service 11/28/2015    Suzi Hernan, [facility] Pager [phone]  If 7PM-7AM, please contact night-coverage [email or web address] Password [email or web address] 11/28/2015, 1:10 AM

## 2015-11-27 NOTE — ED Notes (Signed)
Unable to obtain urine sample d/t pt being very drowsy.

## 2015-11-27 NOTE — ED Notes (Signed)
Patient transported to CT 

## 2015-11-27 NOTE — ED Provider Notes (Signed)
[facility] DEPT Provider Note   CSN: [id number] Arrival date & time: 11/27/15  1452     History   Chief Complaint Chief Complaint  Patient presents with  . Fall  . Weakness    HPI [name] [name] is a 66 y.o. female.  [name] [name] is a 65 y.o. female with h/o chronic pain, fibromyalgia, osteoporosis, anxiety, depression, HTN, stroke with aphasia, substance abuse, seizures presents to ED with complaint of increased weakness, lethargy, and falls. Patient is drowsy on exam and poor historian. Patient reports she has chronic pain and fibromyalgia. She endorses increased weakness over the last 3-4 days. Family friend who cares for patient at bedside reports that patient has not been on narcotic pain medication since October. She was recently rx oxycodone and xanax on Friday, she has taken 10 tabs since Friday afternoon. Since she has had increased lethargy, weakness, and multiple falls. Although, friend does endorse patient has been more unsteady on feet, needing to hold onto objects to get around, and falling more frequently even prior to Friday. Family member also endorses slurred speech and AMS, stating "patient talking out her head." Patient states she has taken 3 oxycodone today and 1/2 xanax. She states she had a fever Friday that has since resolved. She complains of chronic numbness in lower extremities. Denies trouble swallowing, changes in vision, CP, SOB, abdominal pain, N/V/D, dysuria, hematuria, LOC, seizure activity, dizziness, lightheadedness. No loss of bowel or bladder control. No IVDU or h/o cancer. Dr. Jimmye Norman has been managing her narcotic rx.       Past Medical History:  Diagnosis Date  . Allergy   . Anxiety   . Arthritis    lower back, hands  . Depression   . Fibromyalgia   . Fibromyalgia   . GERD (gastroesophageal reflux disease)   . Hx of bronchitis   . Hypertension   . Osteoporosis   . Seizures ([place])    last one 05/2015 - on Keppra   . Sleep apnea    Does not use CPAP  . Stroke [facility]) 2015   some aphasia  . Substance abuse     Patient Active Problem List   Diagnosis Date Noted  . Fall 11/27/2015  . Generalized weakness 11/27/2015  . Hyponatremia 11/27/2015  . Altered mental status 08/19/2013  . Seizures ([place]) 08/19/2013  . Aphasia complicating stroke ([place]) 08/19/2013  . Essential hypertension, benign 08/19/2013  . Hypokalemia 08/19/2013  . Marijuana use 08/19/2013  . Fibromyalgia 08/19/2013    Past Surgical History:  Procedure Laterality Date  . BREAST LUMPECTOMY Right    benign  . CHOLECYSTECTOMY    . TONSILLECTOMY    . WISDOM TOOTH EXTRACTION      OB History    No data available       Home Medications    Prior to Admission medications   Medication Sig Start Date End Date Taking? Authorizing Provider  ALPRAZolam Duanne Moron) 1 MG tablet Take 0.5 mg by mouth 3 (three) times daily as needed for anxiety.   Yes Historical Provider, MD  citalopram (CELEXA) 20 MG tablet Take 20 mg by mouth daily.   Yes Historical Provider, MD  levETIRAcetam (KEPPRA) 250 MG tablet Take 1 tablet (250 mg total) by mouth 2 (two) times daily. 08/22/13  Yes Maryann Mikhail, DO  omeprazole (PRILOSEC) 40 MG capsule Take 40 mg by mouth daily.   Yes Historical Provider, MD  oxyCODONE (ROXICODONE) 15 MG immediate release tablet Take 15 mg by mouth every 8 (eight) hours  as needed for pain. Max of 4 tablets daily   Yes Historical Provider, MD  ALPRAZolam (XANAX) 0.5 MG tablet Take 1 tablet (0.5 mg total) by mouth daily as needed for anxiety. Patient not taking: Reported on 11/27/2015 08/22/13   Velta Addison Mikhail, DO  citalopram (CELEXA) 10 MG tablet Take 1 tablet (10 mg total) by mouth daily. Patient not taking: Reported on 11/27/2015 08/22/13   Velta Addison Mikhail, DO  gabapentin (NEURONTIN) 100 MG capsule Take 1 capsule (100 mg total) by mouth 3 (three) times daily. Patient not taking: Reported on 11/27/2015 08/22/13   Velta Addison Mikhail, DO  potassium chloride  (K-DUR) 10 MEQ tablet Take 1 tablet (10 mEq total) by mouth daily. Patient not taking: Reported on 11/27/2015 08/22/13   Cristal Ford, DO    Family History Family History  Problem Relation Age of Onset  . Alzheimer's disease Mother   . Stroke Father   . Esophageal cancer Maternal Aunt   . Colon cancer Neg Hx   . Rectal cancer Neg Hx   . Stomach cancer Neg Hx     Social History Social History  Substance Use Topics  . Smoking status: Current Every Day Smoker    Packs/day: 0.50    Years: 25.00    Types: Cigarettes  . Smokeless tobacco: Never Used  . Alcohol use No     Allergies   Tetracyclines & related   Review of Systems Review of Systems  Constitutional: Positive for fatigue. Negative for fever.  HENT: Negative for trouble swallowing.   Eyes: Negative for visual disturbance.  Respiratory: Negative for cough and shortness of breath.   Cardiovascular: Negative for chest pain.  Gastrointestinal: Negative for abdominal pain, nausea and vomiting.  Genitourinary: Negative for dysuria and hematuria.  Musculoskeletal: Positive for back pain.       Generalized chronic pain secondary to fibromyalgia  Skin: Negative for rash.  Neurological: Positive for speech difficulty, weakness and numbness ( chronic in b/l lower extremities). Negative for dizziness, seizures, syncope and light-headedness.     Physical Exam Updated Vital Signs BP 110/77 (BP Location: Right Arm)   Pulse 98   Temp 97.7 F (36.5 C) (Oral)   Resp 16   Ht 5\' 2"  (1.575 m)   Wt 77.1 kg   SpO2 96%   BMI 31.09 kg/m   Physical Exam  Constitutional: She appears well-developed. She appears lethargic. She appears ill ( chronically). No distress.  Appears older than stated age. She is drowsy with eyes closing throughout assessment.   HENT:  Head: Normocephalic and atraumatic.  Mouth/Throat: Uvula is midline and oropharynx is clear and moist. Mucous membranes are dry. No trismus in the jaw. No  oropharyngeal exudate. No tonsillar exudate.  Eyes: Conjunctivae and EOM are normal. Pupils are equal, round, and reactive to light. Right eye exhibits no discharge. Left eye exhibits no discharge. No scleral icterus.  Pupils are not pinpoint.   Neck: Normal range of motion and phonation normal. Neck supple. Spinous process tenderness present. No neck rigidity. Normal range of motion present.  No nuchal rigidity.   Cardiovascular: Normal rate, regular rhythm, normal heart sounds and intact distal pulses.   No murmur heard. Pulmonary/Chest: Effort normal and breath sounds normal. No stridor. No respiratory distress. She has no wheezes. She has no rales. She exhibits no tenderness.  Chest expansion symmetric. No rales, rhonchi, or wheezing. No hypoxia.   Abdominal: Soft. Bowel sounds are normal. She exhibits no distension. There is no tenderness. There is no rigidity,  no rebound, no guarding and no CVA tenderness.  Musculoskeletal: Normal range of motion.  C- and L- midline spinal tenderness. Patient requiring assistance up from wheelchair and into bed.   Lymphadenopathy:    She has no cervical adenopathy.  Neurological: She appears lethargic. She is not disoriented. GCS eye subscore is 4. GCS verbal subscore is 5. GCS motor subscore is 6.  At one point during the assessment her thought process is incoherent. Speech is slurred. CN II-XII grossly intact. Patient is able to move all fours. Sensation to light touch intact in all extremities. Strength 5/5 in upper extremities with equal, strong grip strength. Strength 5/5 in lower extremities b/l, equal and strong dorsi/plantarflexion. Pt not cooperative on finger-to-nose assessment.   Skin: Skin is warm and dry. She is not diaphoretic.  Psychiatric: She has a normal mood and affect. Her behavior is normal.     ED Treatments / Results  Labs (all labs ordered are listed, but only abnormal results are displayed) Labs Reviewed  BASIC METABOLIC PANEL  - Abnormal; Notable for the following:       Result Value   Sodium 130 (*)    Potassium 3.2 (*)    Chloride 92 (*)    Glucose, Bld 119 (*)    GFR calc non Af Amer 60 (*)    All other components within normal limits  APTT - Abnormal; Notable for the following:    aPTT 38 (*)    All other components within normal limits  RAPID URINE DRUG SCREEN, HOSP PERFORMED - Abnormal; Notable for the following:    Benzodiazepines POSITIVE (*)    Tetrahydrocannabinol POSITIVE (*)    All other components within normal limits  HEPATIC FUNCTION PANEL - Abnormal; Notable for the following:    Bilirubin, Direct <0.1 (*)    All other components within normal limits  CBG MONITORING, ED - Abnormal; Notable for the following:    Glucose-Capillary 149 (*)    All other components within normal limits  CBG MONITORING, ED - Abnormal; Notable for the following:    Glucose-Capillary 140 (*)    All other components within normal limits  CBC  URINALYSIS, ROUTINE W REFLEX MICROSCOPIC (NOT AT [facility])  PROTIME-INR  ETHANOL  I-STAT TROPOININ, ED    EKG  EKG Interpretation None       Radiology Dg Chest 2 View  Result Date: 11/27/2015 CLINICAL DATA:  Cough, fever and headache today. EXAM: CHEST  2 VIEW COMPARISON:  PA and lateral chest 08/19/2013. FINDINGS: Lung volumes are low but the lungs are clear with a calcified right upper lobe granuloma noted. No pneumothorax or pleural effusion. Large colonic stool burden noted. IMPRESSION: No acute disease. Large colonic stool burden. Electronically Signed   By: Inge Rise M.D.   On: 11/27/2015 17:20   Ct Head Wo Contrast  Result Date: 11/27/2015 CLINICAL DATA:  Acute mental status change.  Lethargic. EXAM: CT HEAD WITHOUT CONTRAST TECHNIQUE: Contiguous axial images were obtained from the base of the skull through the vertex without intravenous contrast. COMPARISON:  August 19, 2013 FINDINGS: Brain: No evidence of acute infarction, hemorrhage, hydrocephalus,  extra-axial collection or mass lesion/mass effect. Vascular: No hyperdense vessel or unexpected calcification. Skull: Normal. Negative for fracture or focal lesion. Sinuses/Orbits: No acute finding. Other: None. IMPRESSION: No acute intracranial abnormality. Electronically Signed   By: Dorise Bullion III M.D   On: 11/27/2015 18:07   Ct Cervical Spine Wo Contrast  Result Date: 11/27/2015 CLINICAL DATA:  65 year old female  with multiple recent falls and increasing weakness for 3-4 days. Altered mental status. Initial encounter. EXAM: CT CERVICAL SPINE WITHOUT CONTRAST TECHNIQUE: Multidetector CT imaging of the cervical spine was performed without intravenous contrast. Multiplanar CT image reconstructions were also generated. COMPARISON:  Head CT without contrast 1746 hours today. Brain MRI 08/20/2013. FINDINGS: Alignment: Mild straightening of lower cervical lordosis. Cervicothoracic junction alignment is within normal limits. Bilateral posterior element alignment is within normal limits. Skull base and vertebrae: Visualized skull base is intact. No atlanto-occipital dissociation. No acute osseous abnormality identified. Soft tissues and spinal canal: Negative visualized brain parenchyma. Calcified atherosclerosis at the skull base. Negative visualized noncontrast neck soft tissues. Disc levels: Negative for age aside from mild to moderate multilevel right side cervical facet hypertrophy, most pronounced at C4-C5 (sagittal image 24). No CT evidence of cervical spinal stenosis. Up to mild right C4 and C6 neural foraminal stenosis, mild to moderate right C5 neural foraminal stenosis. Upper chest: Mild scarring, atelectasis, and left apex paraseptal emphysema. Partially visible calcified aortic arch atherosclerosis. Visualized upper thoracic levels appear intact. IMPRESSION: 1.  No acute osseous abnormality in the cervical spine. 2. No CT evidence of cervical spinal stenosis and overall mild for age cervical spine  degeneration. There is up to moderate right side facet arthropathy, and associated right C5 neural foraminal stenosis. Electronically Signed   By: Genevie Ann M.D.   On: 11/27/2015 19:52   Ct Lumbar Spine Wo Contrast  Result Date: 11/27/2015 CLINICAL DATA:  65 year old female with multiple recent falls and increasing weakness for 3-4 days. Altered mental status. Initial encounter. EXAM: CT LUMBAR SPINE WITHOUT CONTRAST TECHNIQUE: Multidetector CT imaging of the lumbar spine was performed without intravenous contrast administration. Multiplanar CT image reconstructions were also generated. COMPARISON:  Head CT without contrast 1746 hours today. FINDINGS: Segmentation: Normal. Alignment: Normal. Vertebrae: No acute osseous abnormality identified. Visible lower thoracic levels appear intact with degenerative appearing anterior superior endplate irregularity and sclerosis at T12. Lumbar facet arthropathy, detailed below. Visible sacrum and SI joints intact. Paraspinal and other soft tissues: Calcified aortic atherosclerosis. Partially visible cholecystectomy clips and enlargement of the CBD which may simply be postoperative in nature. Negative noncontrast kidneys. Partially visible urinary bladder appears mildly distended. Diverticulosis of the sigmoid colon is partially visible. Retained stool in the colon. Negative visualized posterior paraspinal soft tissues. Disc levels: T11-T12: Vacuum disc. Circumferential disc bulge and endplate spurring. T12-L1:  Mild vacuum disc.  Minimal disc bulge. L1-L2:  Minimal disc bulge. L2-L3:  Minimal disc bulge.  Mild facet hypertrophy. L3-L4: Mild disc space loss. Mild to moderate circumferential disc bulge. Moderate to severe facet hypertrophy. Mild ligament flavum hypertrophy. Mild to moderate spinal stenosis (series 4, image 64). Borderline to mild L3 foraminal stenosis. L4-L5: Mild circumferential disc bulge. Moderate facet hypertrophy. Mild ligament flavum hypertrophy. No  significant spinal stenosis. Borderline to mild L4 foraminal stenosis. L5-S1: Mild circumferential disc bulge. Severe facet hypertrophy. Mild to moderate ligament flavum hypertrophy greater on the left. No spinal stenosis. No convincing L5 foraminal stenosis. IMPRESSION: 1.  No acute osseous abnormality. 2. Lower thoracic disc and endplate degeneration, and moderate to severe lumbar facet degeneration from L3-L4 to L5-S1. Multifactorial mild to moderate spinal stenosis at L3-L4. Up to mild L3 and L4 neural foraminal stenosis. 3.  Calcified aortic atherosclerosis. Electronically Signed   By: Genevie Ann M.D.   On: 11/27/2015 19:47    Procedures Procedures (including critical care time)  Medications Ordered in ED Medications  sodium chloride 0.9 %  bolus 500 mL (0 mLs Intravenous Stopped 11/27/15 2239)     Initial Impression / Assessment and Plan / ED Course  I have reviewed the triage vital signs and the nursing notes.  Pertinent labs & imaging results that were available during my care of the patient were reviewed by me and considered in my medical decision making (see chart for details).  Clinical Course as of Nov 27 2318  Sun Nov 27, 2015  1800 Low lung volumes. Stool noted in colon. Calcified granuloma in RUL.  DG Chest 2 View [AM]  [id number] Reviewed CT Head Wo Contrast [AM]  2030 Reviewed CT Lumbar Spine Wo Contrast [AM]  2030 Reviewed CT Cervical Spine Wo Contrast [AM]    Clinical Course User Index [AM] Roxanna Mew, PA-C    Patient presents to ED with weakness and increased falls. Patient is afebrile and non-toxic, chronically-ill appearing in NAD. VSS.  Patient is drowsy on assessment. She required assistance from wheelchair to bed on initial arrival. Per family friend patient has had multiple falls and been unsteady on her feet. Midline TTP of cervical and lumbar spine. Speech is slightly slurred. CN II-XII grossly intact. Sensation intact. Strength intact. Pt recently restarted on  narcotic pain medication on Friday, per friend she has taken 10 pills since Friday afternoon. Patient also has history of stroke. Concern for ?overuse of medication vs. ?stroke. Will check labs, CT head/neck/lumbar spine, CXR, EKG. Shared visit with Dr. Tamera Punt.   CMP remarkable for hyponatremia and hypochloremia - ?dehydration, gentle hydration performed. UDS +BZD (on xanax) and THC. U/A negative for UTI. CBC re-assuring. Mild hyperglycemia without AG. Trop neg. CXR shows low lung volumes and stool in colon, no acute cardiopulmonary process.   CT head shows no acute intracranial pathology. No acute abnormality in c-spine, degenerative changes noted. No acute abnormality in lumbar spine, spinal stenosis and lumbar facet degeneration noted.   Attempted to ambulate patient; however, required significant assistance. Given increase in falls and lethargy will consult hospitalist for obs admission for generalized weakness and falls. Pt likely benefit from PT/OT evaluation and home health services.   11:19 PM: Spoke with Dr. Blaine Hamper of [facility], greatly appreciate his time and input, agree to admit patient for further management of weakness and falls. Thank you for your continued care of this patient.   Final Clinical Impressions(s) / ED Diagnoses   Final diagnoses:  Generalized weakness  Frequent falls    New Prescriptions New Prescriptions   No medications on file     Roxanna Mew, PA-C 11/27/15 2321    Malvin Johns, MD 11/27/15 2326

## 2015-11-27 NOTE — ED Notes (Signed)
Pt BIB family from home with report of incr'd weakness over the past 3-4 days, with several falls during that time. Pt has fibromyalgia and chronic pain issues that she takes xanax and oxy for. Family reports pt having "taken extra" xanax & pain meds recently d/t incr'd pain. Pt usually independent, but now unable to ambulate without maximal assist, or remain awake without constant interaction.   Pt alert to pain & repeated verbal prompting. Failed initial Stroke Swallow Screen d/t inability to remain awake. Ordered SLP evaluation. Pt given 520mL NS bolus but no other medications. IV site to the right AC, presently saline locked. VSS and WNL throughout.

## 2015-11-27 NOTE — ED Triage Notes (Signed)
Onset 3-4 days increased weakness and has fallen 4-5 times.  NO injuries from falls.  Pt takes pain meds and Xanax for fibromyalgia and back pain.  Pt is slurring words and eyes drift closed.  Pt reports having to take "extra" pain meds and Xanax for increased pain.

## 2015-11-28 ENCOUNTER — Observation Stay (HOSPITAL_COMMUNITY): Payer: Medicare Other

## 2015-11-28 DIAGNOSIS — R531 Weakness: Principal | ICD-10-CM

## 2015-11-28 DIAGNOSIS — M797 Fibromyalgia: Secondary | ICD-10-CM | POA: Diagnosis not present

## 2015-11-28 DIAGNOSIS — W19XXXA Unspecified fall, initial encounter: Secondary | ICD-10-CM | POA: Diagnosis not present

## 2015-11-28 DIAGNOSIS — I10 Essential (primary) hypertension: Secondary | ICD-10-CM | POA: Diagnosis not present

## 2015-11-28 DIAGNOSIS — W19XXXS Unspecified fall, sequela: Secondary | ICD-10-CM | POA: Diagnosis not present

## 2015-11-28 LAB — OSMOLALITY, URINE: OSMOLALITY UR: 269 mosm/kg — AB (ref 300–900)

## 2015-11-28 LAB — BASIC METABOLIC PANEL
ANION GAP: 10 (ref 5–15)
ANION GAP: 9 (ref 5–15)
ANION GAP: 9 (ref 5–15)
ANION GAP: 9 (ref 5–15)
BUN: 7 mg/dL (ref 6–20)
BUN: 7 mg/dL (ref 6–20)
BUN: 7 mg/dL (ref 6–20)
BUN: 9 mg/dL (ref 6–20)
CALCIUM: 9.2 mg/dL (ref 8.9–10.3)
CALCIUM: 9.3 mg/dL (ref 8.9–10.3)
CALCIUM: 9.4 mg/dL (ref 8.9–10.3)
CALCIUM: 9.5 mg/dL (ref 8.9–10.3)
CHLORIDE: 97 mmol/L — AB (ref 101–111)
CO2: 26 mmol/L (ref 22–32)
CO2: 27 mmol/L (ref 22–32)
CO2: 27 mmol/L (ref 22–32)
CO2: 28 mmol/L (ref 22–32)
CREATININE: 0.88 mg/dL (ref 0.44–1.00)
Chloride: 96 mmol/L — ABNORMAL LOW (ref 101–111)
Chloride: 97 mmol/L — ABNORMAL LOW (ref 101–111)
Chloride: 98 mmol/L — ABNORMAL LOW (ref 101–111)
Creatinine, Ser: 0.88 mg/dL (ref 0.44–1.00)
Creatinine, Ser: 0.95 mg/dL (ref 0.44–1.00)
Creatinine, Ser: 0.95 mg/dL (ref 0.44–1.00)
GFR calc Af Amer: >60 mL/min (ref >60)
GFR calc Af Amer: >60 mL/min (ref >60)
GFR calc non Af Amer: >60 mL/min (ref >60)
GLUCOSE: 105 mg/dL — AB (ref 65–99)
GLUCOSE: 134 mg/dL — AB (ref 65–99)
GLUCOSE: 121 mg/dL — AB (ref 65–99)
Glucose, Bld: 110 mg/dL — ABNORMAL HIGH (ref 65–99)
POTASSIUM: 2.9 mmol/L — AB (ref 3.5–5.1)
POTASSIUM: 3 mmol/L — AB (ref 3.5–5.1)
POTASSIUM: 3.3 mmol/L — AB (ref 3.5–5.1)
Potassium: 3.4 mmol/L — ABNORMAL LOW (ref 3.5–5.1)
SODIUM: 133 mmol/L — AB (ref 135–145)
SODIUM: 133 mmol/L — AB (ref 135–145)
SODIUM: 134 mmol/L — AB (ref 135–145)
Sodium: 133 mmol/L — ABNORMAL LOW (ref 135–145)

## 2015-11-28 LAB — TSH: TSH: 0.966 u[IU]/mL (ref 0.350–4.500)

## 2015-11-28 LAB — SODIUM, URINE, RANDOM: SODIUM UR: 53 mmol/L

## 2015-11-28 LAB — CBC
HEMATOCRIT: 38 % (ref 36.0–46.0)
HEMOGLOBIN: 13.4 g/dL (ref 12.0–15.0)
MCH: 30 pg (ref 26.0–34.0)
MCHC: 35.3 g/dL (ref 30.0–36.0)
MCV: 85.2 fL (ref 78.0–100.0)
Platelets: 363 10*3/uL (ref 150–400)
RBC: 4.46 MIL/uL (ref 3.87–5.11)
RDW: 13.7 % (ref 11.5–15.5)
WBC: 6.8 10*3/uL (ref 4.0–10.5)

## 2015-11-28 LAB — MAGNESIUM
MAGNESIUM: 1.9 mg/dL (ref 1.7–2.4)
Magnesium: 1.9 mg/dL (ref 1.7–2.4)

## 2015-11-28 LAB — GLUCOSE, CAPILLARY
GLUCOSE-CAPILLARY: 119 mg/dL — AB (ref 65–99)
Glucose-Capillary: 161 mg/dL — ABNORMAL HIGH (ref 65–99)

## 2015-11-28 LAB — ETHANOL: Alcohol, Ethyl (B): <5 mg/dL (ref <5)

## 2015-11-28 LAB — OSMOLALITY: Osmolality: 273 mOsm/kg — ABNORMAL LOW (ref 275–295)

## 2015-11-28 MED ORDER — SODIUM CHLORIDE 0.9 % IV SOLN: INTRAVENOUS | Status: DC

## 2015-11-28 MED ORDER — POTASSIUM CHLORIDE CRYS ER 20 MEQ PO TBCR
40.0000 meq | EXTENDED_RELEASE_TABLET | Freq: Two times a day (BID) | ORAL | Status: DC
  Administered 2015-11-28: 40 meq via ORAL
  Filled 2015-11-28: qty 2

## 2015-11-28 MED ORDER — GADOBENATE DIMEGLUMINE 529 MG/ML IV SOLN
17.0000 mL | Freq: Once | INTRAVENOUS | Status: AC | PRN
  Administered 2015-11-28: 17 mL via INTRAVENOUS

## 2015-11-28 MED ORDER — SODIUM CHLORIDE 0.9 % IV SOLN
30.0000 meq | Freq: Once | INTRAVENOUS | Status: AC
  Administered 2015-11-28: 30 meq via INTRAVENOUS
  Filled 2015-11-28: qty 15

## 2015-11-28 MED ORDER — MAGNESIUM SULFATE 2 GM/50ML IV SOLN
2.0000 g | Freq: Once | INTRAVENOUS | Status: AC
  Administered 2015-11-28: 2 g via INTRAVENOUS
  Filled 2015-11-28: qty 50

## 2015-11-28 NOTE — Evaluation (Signed)
Clinical/Bedside Swallow Evaluation Patient Details  Name: [name] [name] MRN: [id number] Date of Birth: 1951-01-08  Today's Date: 11/28/2015 Time: SLP Start Time (ACUTE ONLY): 1051 SLP Stop Time (ACUTE ONLY): 1106 SLP Time Calculation (min) (ACUTE ONLY): 15 min  Past Medical History:  Past Medical History:  Diagnosis Date  . Allergy   . Anxiety   . Arthritis    lower back, hands  . Depression   . Fibromyalgia   . Fibromyalgia   . GERD (gastroesophageal reflux disease)   . Hx of bronchitis   . Hypertension   . Osteoporosis   . Seizures ([place])    last one 05/2015 - on Keppra   . Sleep apnea    Does not use CPAP  . Stroke [facility]) 2015   some aphasia  . Substance abuse    Past Surgical History:  Past Surgical History:  Procedure Laterality Date  . BREAST LUMPECTOMY Right    benign  . CHOLECYSTECTOMY    . TONSILLECTOMY    . WISDOM TOOTH EXTRACTION     HPI:  [name] Harperis a 65 y.o.femalewith medical history significant of hypertension, GERD, depression, seizure, stroke, marijuana abuse, tobacco abuse, fibromyalgia, OSA not onCPAP, who presents with frequent fall and generalized weakness.Per chart family reports pt having "taken extra" xanax &pain meds recently d/t incr'd pain. egative CT head,    Assessment / Plan / Recommendation Clinical Impression  Oral manipulation, transit and pharygneal phase of swallow appeared normal. No indications of penetration or aspiration. Regular texture and thin liquids ordered. No need for ST follow up.     Aspiration Risk  Mild aspiration risk    Diet Recommendation Regular;Thin liquid   Liquid Administration via: Cup;Straw Medication Administration: Whole meds with liquid Supervision: Patient able to self feed Postural Changes: Seated upright at 90 degrees    Other  Recommendations Oral Care Recommendations: Oral care BID   Follow up Recommendations None      Frequency and Duration            Prognosis         Swallow Study   General HPI: [name] Harperis a 65 y.o.femalewith medical history significant of hypertension, GERD, depression, seizure, stroke, marijuana abuse, tobacco abuse, fibromyalgia, OSA not onCPAP, who presents with frequent fall and generalized weakness.Per chart family reports pt having "taken extra" xanax &pain meds recently d/t incr'd pain. egative CT head,  Type of Study: Bedside Swallow Evaluation Previous Swallow Assessment:  (none found) Diet Prior to this Study: NPO Temperature Spikes Noted: No Respiratory Status: Room air History of Recent Intubation: No Behavior/Cognition: Alert;Cooperative;Pleasant mood Oral Cavity Assessment: Within Functional Limits Oral Care Completed by SLP: Yes Oral Cavity - Dentition: Dentures, top (lower denture not in hospital) Vision: Functional for self-feeding Self-Feeding Abilities: Able to feed self Patient Positioning: Upright in bed Baseline Vocal Quality: Normal Volitional Cough: Strong Volitional Swallow: Able to elicit    Oral/Motor/Sensory Function Overall Oral Motor/Sensory Function: Within functional limits   Ice Chips Ice chips: Not tested   Thin Liquid Thin Liquid: Within functional limits Presentation: Cup;Straw    Nectar Thick Nectar Thick Liquid: Not tested   Honey Thick Honey Thick Liquid: Not tested   Puree Puree: Within functional limits   Solid   GO   Solid: Within functional limits Presentation: Self Fed        Houston Siren 11/28/2015,11:15 AM Orbie Pyo Colvin Caroli.Ed [employer] [phone]

## 2015-11-28 NOTE — Procedures (Signed)
ELECTROENCEPHALOGRAM REPORT  Date of Study: 11/28/2015  Patient's Name: [name] [name] MRN: [id number] Date of Birth: [birth year]  Referring Provider: Dr. Ivor Costa  Clinical History: This is a 65 year old woman with a history of seizures, with frequent falls and generalized weakness.  Medications: levETIRAcetam (KEPPRA) 250 mg in sodium chloride 0.9 % 100 mL IVPB  ALPRAZolam (XANAX) tablet 0.25 mg  acetaminophen (TYLENOL) tablet 650 mg  aspirin EC tablet 81 mg  citalopram (CELEXA) tablet 20 mg  enoxaparin (LOVENOX) injection 40 mg  magnesium sulfate IVPB 2 g 50 mL  oxyCODONE (Oxy IR/ROXICODONE) immediate release tablet 5 mg  pantoprazole (PROTONIX) EC tablet 40 mg  potassium chloride 20 MEQ/15ML (10%) solution 40 mEq  potassium chloride SA (K-DUR,KLOR-CON) CR tablet 40 mEq  sodium chloride flush (NS) 0.9 % injection 3 mL   Technical Summary: A multichannel digital EEG recording measured by the international 10-20 system with electrodes applied with paste and impedances below 5000 ohms performed in our laboratory with EKG monitoring in an awake and asleep patient.  Hyperventilation was not performed. Photic stimulation was performed.  The digital EEG was referentially recorded, reformatted, and digitally filtered in a variety of bipolar and referential montages for optimal display.    Description: The patient is awake and asleep during the recording.  During maximal wakefulness, there is a symmetric, medium voltage 8 Hz posterior dominant rhythm that poorly attenuates with eye opening and eye closure.  There is occasional independent focal 4-5 Hz slowing seen over the bilateral temporal regions, left greater than right, at times sharply contoured without clear epileptogenic potential. During drowsiness and stage I sleep, there is an increase in theta slowing of the background, with occasional vertex waves seen.  Photic stimulation did not elicit any abnormalities.  There were no  epileptiform discharges or electrographic seizures seen.    EKG lead was unremarkable.  Impression: This awake and asleep EEG is abnormal due to occasional independent focal slowing over the bilateral temporal regions, left greater than right.  Clinical Correlation of the above findings indicates focal cerebral dysfunction over the bilateral temporal regions suggestive of underlying structural or physiologic abnormality. The absence of epileptiform discharges does not exclude a clinical diagnosis of epilepsy.Clinical correlation is advised.   Ellouise Newer, M.D.

## 2015-11-28 NOTE — Progress Notes (Signed)
Physical Therapy Brief Note (full evaluation to follow)    11/28/15 1040  Home Living  Family/patient expects to be discharged to: Private residence  Living Arrangements Non-relatives/Friends (friend is a [employer]ar[employer] manager)  Available Help at Discharge Friend(s);Family;Available 24 hours/day (son local)  Type of [place] to enter  Entrance Stairs-Number of Steps 3  Entrance Stairs-Rails Left  Home Layout One level  Bathroom Shower/Tub Tub/shower unit  Home Equipment None  Prior Function  Level of Independence Independent  Comments reports recent falls x 1 week; previous fall "months ago"  PT Recommendation  Follow Up Recommendations Home health PT;Supervision for mobility/OOB  PT equipment Rolling walker with 5" wheels    11/28/2015 Barry Brunner, PT Pager: [phone]

## 2015-11-28 NOTE — Discharge Summary (Signed)
Physician Discharge Summary  [name] [name] [id number] DOB: 04/06/1950 DOA: 11/27/2015  PCP: Bartholome Bill, MD  Admit date: 11/27/2015 Discharge date: 11/28/2015  Admitted From: home Disposition:  Home  Recommendations for Outpatient Follow-up:  1. Follow up with PCP in 1-2 weeks 2. Please obtain BMP/CBC in one week 3. Please follow up on the following pending results:  Home Health:yes Equipment/Devices:no  Discharge Condition:stable CODE STATUS:full Diet recommendation: Heart Healthy   Brief/Interim Summary: 65 y.o. female with medical history significant of hypertension, GERD, depression, seizure, stroke, marijuana abuse, tobacco abuse, fibromyalgia, OSA not on CPAP, who presents with frequent fall and generalized weakness.  Patient states that she generalized weakness, particularly in both legs. She states that fell at least 5 times in the past 24 hours, no head or neck injury. No LOC. She has poor hearing, which is not new. No vision change. Per EDP, patient seemed to have mild slurred words, but it seems to me that pt just mumbles when talking.  Discharge Diagnoses:  Principal Problem:   Fall Active Problems:   Essential hypertension, benign   Hypokalemia   Marijuana use   Fibromyalgia   Generalized weakness   Hyponatremia  Fall And generalized weakness: CT lumbar spine did not show any acute issues. Likely due to deconditioning. Normal and some telemetry MRI of the brain acute findings, mild chronic microvascular changes. MRI of the lumbar spine show multiple degenerative disease with no significant spinal cord stenosis. Multiple levels of degenerative disease. Therapy evaluate the patient recommended home health PT with a walker. Patient was able to void and had a bowel movement today.  Essential hypertension, benign On No medications at home continue to monitor, is mildly elevated. We'll need to follow-up with primary care doctor.  Hypokalemia: Was  repleted orally.  Marijuana use: Counseling.  Fibromyalgia Will need to follow-up with her primary care doctor as an outpatient.  Hyponatremia: Likely prerenal, there is no need for urinary sodium and urine Cr as she has recieved significant amount of IV fluids. She is hypochloremic hypokalemic and with hyponatremia, this improved with IV fluid hydration.   Discharge Instructions  Discharge Instructions    Diet - low sodium heart healthy    Complete by:  As directed    Increase activity slowly    Complete by:  As directed        Medication List    TAKE these medications   ALPRAZolam 1 MG tablet Commonly known as:  XANAX Take 0.5 mg by mouth 3 (three) times daily as needed for anxiety.   ALPRAZolam 0.5 MG tablet Commonly known as:  XANAX Take 1 tablet (0.5 mg total) by mouth daily as needed for anxiety.   citalopram 10 MG tablet Commonly known as:  CELEXA Take 1 tablet (10 mg total) by mouth daily. What changed:  Another medication with the same name was removed. Continue taking this medication, and follow the directions you see here.   gabapentin 100 MG capsule Commonly known as:  NEURONTIN Take 1 capsule (100 mg total) by mouth 3 (three) times daily.   levETIRAcetam 250 MG tablet Commonly known as:  KEPPRA Take 1 tablet (250 mg total) by mouth 2 (two) times daily.   omeprazole 40 MG capsule Commonly known as:  PRILOSEC Take 40 mg by mouth daily.   oxyCODONE 15 MG immediate release tablet Commonly known as:  ROXICODONE Take 15 mg by mouth every 8 (eight) hours as needed for pain. Max of 4 tablets daily   potassium  chloride 10 MEQ tablet Commonly known as:  K-DUR Take 1 tablet (10 mEq total) by mouth daily.       Allergies  Allergen Reactions  . Tetracyclines & Related Other (See Comments)    Causes pains    Consultations:  None   Procedures/Studies: Dg Chest 2 View  Result Date: 11/27/2015 CLINICAL DATA:  Cough, fever and headache today.  EXAM: CHEST  2 VIEW COMPARISON:  PA and lateral chest 08/19/2013. FINDINGS: Lung volumes are low but the lungs are clear with a calcified right upper lobe granuloma noted. No pneumothorax or pleural effusion. Large colonic stool burden noted. IMPRESSION: No acute disease. Large colonic stool burden. Electronically Signed   By: Inge Rise M.D.   On: 11/27/2015 17:20   Ct Head Wo Contrast  Result Date: 11/27/2015 CLINICAL DATA:  Acute mental status change.  Lethargic. EXAM: CT HEAD WITHOUT CONTRAST TECHNIQUE: Contiguous axial images were obtained from the base of the skull through the vertex without intravenous contrast. COMPARISON:  August 19, 2013 FINDINGS: Brain: No evidence of acute infarction, hemorrhage, hydrocephalus, extra-axial collection or mass lesion/mass effect. Vascular: No hyperdense vessel or unexpected calcification. Skull: Normal. Negative for fracture or focal lesion. Sinuses/Orbits: No acute finding. Other: None. IMPRESSION: No acute intracranial abnormality. Electronically Signed   By: Dorise Bullion III M.D   On: 11/27/2015 18:07   Ct Cervical Spine Wo Contrast  Result Date: 11/27/2015 CLINICAL DATA:  65 year old female with multiple recent falls and increasing weakness for 3-4 days. Altered mental status. Initial encounter. EXAM: CT CERVICAL SPINE WITHOUT CONTRAST TECHNIQUE: Multidetector CT imaging of the cervical spine was performed without intravenous contrast. Multiplanar CT image reconstructions were also generated. COMPARISON:  Head CT without contrast 1746 hours today. Brain MRI 08/20/2013. FINDINGS: Alignment: Mild straightening of lower cervical lordosis. Cervicothoracic junction alignment is within normal limits. Bilateral posterior element alignment is within normal limits. Skull base and vertebrae: Visualized skull base is intact. No atlanto-occipital dissociation. No acute osseous abnormality identified. Soft tissues and spinal canal: Negative visualized brain  parenchyma. Calcified atherosclerosis at the skull base. Negative visualized noncontrast neck soft tissues. Disc levels: Negative for age aside from mild to moderate multilevel right side cervical facet hypertrophy, most pronounced at C4-C5 (sagittal image 24). No CT evidence of cervical spinal stenosis. Up to mild right C4 and C6 neural foraminal stenosis, mild to moderate right C5 neural foraminal stenosis. Upper chest: Mild scarring, atelectasis, and left apex paraseptal emphysema. Partially visible calcified aortic arch atherosclerosis. Visualized upper thoracic levels appear intact. IMPRESSION: 1.  No acute osseous abnormality in the cervical spine. 2. No CT evidence of cervical spinal stenosis and overall mild for age cervical spine degeneration. There is up to moderate right side facet arthropathy, and associated right C5 neural foraminal stenosis. Electronically Signed   By: Genevie Ann M.D.   On: 11/27/2015 19:52   Ct Lumbar Spine Wo Contrast  Result Date: 11/27/2015 CLINICAL DATA:  65 year old female with multiple recent falls and increasing weakness for 3-4 days. Altered mental status. Initial encounter. EXAM: CT LUMBAR SPINE WITHOUT CONTRAST TECHNIQUE: Multidetector CT imaging of the lumbar spine was performed without intravenous contrast administration. Multiplanar CT image reconstructions were also generated. COMPARISON:  Head CT without contrast 1746 hours today. FINDINGS: Segmentation: Normal. Alignment: Normal. Vertebrae: No acute osseous abnormality identified. Visible lower thoracic levels appear intact with degenerative appearing anterior superior endplate irregularity and sclerosis at T12. Lumbar facet arthropathy, detailed below. Visible sacrum and SI joints intact. Paraspinal and other soft  tissues: Calcified aortic atherosclerosis. Partially visible cholecystectomy clips and enlargement of the CBD which may simply be postoperative in nature. Negative noncontrast kidneys. Partially visible  urinary bladder appears mildly distended. Diverticulosis of the sigmoid colon is partially visible. Retained stool in the colon. Negative visualized posterior paraspinal soft tissues. Disc levels: T11-T12: Vacuum disc. Circumferential disc bulge and endplate spurring. T12-L1:  Mild vacuum disc.  Minimal disc bulge. L1-L2:  Minimal disc bulge. L2-L3:  Minimal disc bulge.  Mild facet hypertrophy. L3-L4: Mild disc space loss. Mild to moderate circumferential disc bulge. Moderate to severe facet hypertrophy. Mild ligament flavum hypertrophy. Mild to moderate spinal stenosis (series 4, image 64). Borderline to mild L3 foraminal stenosis. L4-L5: Mild circumferential disc bulge. Moderate facet hypertrophy. Mild ligament flavum hypertrophy. No significant spinal stenosis. Borderline to mild L4 foraminal stenosis. L5-S1: Mild circumferential disc bulge. Severe facet hypertrophy. Mild to moderate ligament flavum hypertrophy greater on the left. No spinal stenosis. No convincing L5 foraminal stenosis. IMPRESSION: 1.  No acute osseous abnormality. 2. Lower thoracic disc and endplate degeneration, and moderate to severe lumbar facet degeneration from L3-L4 to L5-S1. Multifactorial mild to moderate spinal stenosis at L3-L4. Up to mild L3 and L4 neural foraminal stenosis. 3.  Calcified aortic atherosclerosis. Electronically Signed   By: Genevie Ann M.D.   On: 11/27/2015 19:47   Mr Brain Wo Contrast  Result Date: 11/28/2015 CLINICAL DATA:  65 year old female with history of hypertension, seizure, stroke, marijuana use, tobacco use and sleep apnea presenting with generalize weakness. Subsequent encounter. EXAM: MRI HEAD WITHOUT CONTRAST TECHNIQUE: Multiplanar, multiecho pulse sequences of the brain and surrounding structures were obtained without intravenous contrast. COMPARISON:  11/27/2015 head CT.  08/20/2013 brain MR. FINDINGS: Exam is motion degraded. Brain: No acute infarct or intracranial hemorrhage. Mild chronic  microvascular changes. Mild global atrophy without hydrocephalus. No intracranial mass lesion noted on this unenhanced exam. Partially empty slightly expanded sella unchanged. No flattening of the posterior globes as seen with pseudotumor. Hyperostosis frontalis interna incidentally noted. Vascular: Major intracranial vascular structures are patent and ectatic. Skull and upper cervical spine: Negative. Sinuses/Orbits: No acute orbital abnormality. Minimal mucosal thickening ethmoid sinus air cell and maxillary sinuses. Other: Negative IMPRESSION: Exam is motion degraded. No acute infarct or intracranial hemorrhage. Mild chronic microvascular changes. Mild global atrophy. Partially empty slightly expanded sella unchanged. Electronically Signed   By: Genia Del M.D.   On: 11/28/2015 07:15   Mr Lumbar Spine W Wo Contrast  Result Date: 11/28/2015 CLINICAL DATA:  65 year old female with generalized weakness particularly in legs. Recent falls. Subsequent encounter. EXAM: MRI LUMBAR SPINE WITHOUT AND WITH CONTRAST TECHNIQUE: Multiplanar and multiecho pulse sequences of the lumbar spine were obtained without and with intravenous contrast. CONTRAST:  36mL MULTIHANCE GADOBENATE DIMEGLUMINE 529 MG/ML IV SOLN COMPARISON:  11/27/2015 lumbar spine CT. FINDINGS: Portions of exam are motion degraded. Segmentation: Last fully open disk space is labeled L5-S1. Present examination incorporates from T10-11 disc space through lower sacrum. Alignment:  Minimal curvature. Vertebrae: T11 superior endplate moderate size Schmorl's node deformity with minimal enhancement surrounding edema suggesting this is subacute to remote in origin. Anterior T12 superior endplate bony reactive changes felt to be degenerative in origin. Minimal L1-L2 superior endplate degenerative reactive changes. Right T11-12 facet degenerative changes. L3-4 and L5-S1 marked bilateral facet degenerative changes. Moderate L4-5 facet degenerative changes. Conus  medullaris: Extends to the just below the L1-2 disc space level and appears normal. Paraspinal and other soft tissues: Prominent size urinary bladder. It is possible  this is related to the fact the patient did not void prior to the current exam. Spinal stenosis in the lower thoracic region without significant cord compression. If remainder of thoracic cord needs be evaluated based on clinical findings than thoracic spine MR would be necessary for further delineation. Dilated common bile duct of indeterminate etiology incompletely assessed on present exam. Disc levels: T10-11: Minimal bulge. Facet degenerative changes. Circumferential mild spinal stenosis. T11-12: Facet degenerative changes greater on right. Mild bulge. Mild narrowing ventral thecal sac with minimal cord contact. Mild to moderate right-sided and moderate left-sided foraminal narrowing. T12-L1:  No spinal stenosis or foraminal narrowing. L1-2:  Minimal facet degenerative changes. L2-3:  Mild facet degenerative changes. L3-4: Prominent facet degenerative changes. Fluid within the degenerated facet joints with mild surrounding enhancement and edema. Posterior inferiorly directed left-sided synovial cyst. Ligamentum flavum hypertrophy. Bulge. Multifactorial moderate spinal stenosis and mild bilateral foraminal narrowing. L4-5: Moderate facet degenerative changes. No significant spinal stenosis or foraminal narrowing. L5-S1: Marked bilateral facet degenerative changes. Ligamentum flavum hypertrophy. Minimal to mild bulge. Minimal narrowing superior aspect lateral recess bilaterally. Very mild bilateral foraminal narrowing. IMPRESSION: L3-4 multifactorial moderate spinal stenosis and mild bilateral foraminal narrowing. L4-5 moderate facet degenerative changes. No significant spinal stenosis or foraminal narrowing. L5-S1 marked bilateral facet degenerative changes. Minimal to mild bulge. Minimal narrowing superior aspect lateral recess bilaterally. Very  mild bilateral foraminal narrowing. T11-12 facet degenerative changes greater on right. Mild bulge. Mild narrowing ventral thecal sac with minimal cord contact. Mild to moderate right-sided and moderate left-sided foraminal narrowing. T10-11 circumferential mild spinal stenosis. T11 superior endplate moderate size Schmorl's node deformity with minimal enhancement surrounding edema suggesting this is subacute to remote in origin. Prominent size urinary bladder. It is possible this is related to the fact the patient did not void prior to the current exam. Spinal stenosis in the lower thoracic region without significant cord compression. If remainder of thoracic cord needs be evaluated based on clinical findings than thoracic spine MR would be necessary for further delineation. Dilated common bile duct of indeterminate etiology incompletely assessed on present exam. Electronically Signed   By: Genia Del M.D.   On: 11/28/2015 07:46      Subjective: No complains of back pain, able to void and had a bowel movement today.  Discharge Exam: Vitals:   11/28/15 0018 11/28/15 1013  BP: 139/73 119/70  Pulse: 76 77  Resp: 15 19  Temp: 97.7 F (36.5 C) 98.4 F (36.9 C)   Vitals:   11/27/15 2300 11/27/15 2330 11/28/15 0018 11/28/15 1013  BP: 123/59 131/82 139/73 119/70  Pulse: 81 82 76 77  Resp: 15 14 15 19   Temp:   97.7 F (36.5 C) 98.4 F (36.9 C)  TempSrc:   Oral Oral  SpO2: 95% 95% 91% 94%  Weight:      Height:        General: Pt is alert, awake, not in acute distress Cardiovascular: RRR, S1/S2 +, no rubs, no gallops Respiratory: CTA bilaterally, no wheezing, no rhonchi Abdominal: Soft, NT, ND, bowel sounds + Extremities: no edema, no cyanosis Neurological: Muscle strength is followed by renal for extremity deep tendon reflexes 2+ symmetrical bilateral pleural hyperreflexia.   The results of significant diagnostics from this hospitalization (including imaging, microbiology, ancillary  and laboratory) are listed below for reference.     Microbiology: No results found for this or any previous visit (from the past 240 hour(s)).   Labs: BNP (last 3 results) No results for  input(s): BNP in the last 8760 hours. Basic Metabolic Panel:  Recent Labs Lab 11/27/15 1137 11/27/15 2335 11/28/15 0718 11/28/15 1157  NA 130* 133* 133* 134*  K 3.2* 2.9* 3.3* 3.0*  CL 92* 96* 97* 98*  CO2 27 28 26 27   GLUCOSE 119* 110* 105* 134*  BUN 9 9 7 7   CREATININE 0.97 0.88 0.95 0.95  CALCIUM 10.0 9.2 9.3 9.4  MG  --   --  1.9  --    Liver Function Tests:  Recent Labs Lab 11/27/15 1854  AST 26  ALT 18  ALKPHOS 108  BILITOT 0.4  PROT 6.5  ALBUMIN 3.6   No results for input(s): LIPASE, AMYLASE in the last 168 hours. No results for input(s): AMMONIA in the last 168 hours. CBC:  Recent Labs Lab 11/27/15 1137 11/28/15 0718  WBC 9.4 6.8  HGB 14.1 13.4  HCT 39.4 38.0  MCV 84.9 85.2  PLT 376 363   Cardiac Enzymes: No results for input(s): CKTOTAL, CKMB, CKMBINDEX, TROPONINI in the last 168 hours. BNP: Invalid input(s): POCBNP CBG:  Recent Labs Lab 11/27/15 1534 11/27/15 1637 11/28/15 0806 11/28/15 1138  GLUCAP 149* 140* 161* 119*   D-Dimer No results for input(s): DDIMER in the last 72 hours. Hgb A1c No results for input(s): HGBA1C in the last 72 hours. Lipid Profile No results for input(s): CHOL, HDL, LDLCALC, TRIG, CHOLHDL, LDLDIRECT in the last 72 hours. Thyroid function studies  Recent Labs  11/28/15 0718  TSH 0.966   Anemia work up No results for input(s): VITAMINB12, FOLATE, FERRITIN, TIBC, IRON, RETICCTPCT in the last 72 hours. Urinalysis    Component Value Date/Time   COLORURINE YELLOW 11/27/2015 1743   APPEARANCEUR CLEAR 11/27/2015 1743   LABSPEC 1.009 11/27/2015 1743   PHURINE 7.0 11/27/2015 1743   GLUCOSEU NEGATIVE 11/27/2015 1743   HGBUR NEGATIVE 11/27/2015 1743   BILIRUBINUR NEGATIVE 11/27/2015 1743   KETONESUR NEGATIVE 11/27/2015  1743   PROTEINUR NEGATIVE 11/27/2015 1743   UROBILINOGEN 1.0 08/19/2013 1650   NITRITE NEGATIVE 11/27/2015 1743   LEUKOCYTESUR NEGATIVE 11/27/2015 1743   Sepsis Labs Invalid input(s): PROCALCITONIN,  WBC,  LACTICIDVEN Microbiology No results found for this or any previous visit (from the past 240 hour(s)).   Time coordinating discharge: Over 30 minutes  SIGNED:   Charlynne Cousins, MD  [facility] 11/28/2015, 1:47 PM Pager   If 7PM-7AM, please contact night-coverage [email or web address] Password [email or web address]

## 2015-11-28 NOTE — Progress Notes (Signed)
EEG Completed; Results Pending  

## 2015-11-28 NOTE — Evaluation (Signed)
Occupational Therapy Evaluation and Discharge Patient Details Name: [name] [name] MRN: [id number] DOB: 19-Apr-1950 Today's Date: 11/28/2015    History of Present Illness 65 y/o female presenting in ED with altered mental status and fall at home. Pt  has a past medical history of Allergy; Anxiety; Arthritis; Depression; Fibromyalgia; Fibromyalgia; GERD; bronchitis; Hypertension; Osteoporosis; Seizures; Sleep apnea; Stroke(2015); and Substance abuse.   Clinical Impression   PTA Pt independent in ADL and mobility with recent decrease in independence requiring max assist mainly due to lethargy. Pt currently alert and min guard/supervision for ADL and transfers. Pt will benefit from 3 in 1 going home to maximize safety and prevent falls as well as assist with power up. Pt agreeable to this.Pt shared that she used to walk every morning, and wants to get back to walking. Thank you for this referral, No further OT needs at this time. Pt at adequate level for d/c.    Follow Up Recommendations  No OT follow up;Supervision - Intermittent    Equipment Recommendations  3 in 1 bedside comode    Recommendations for Other Services       Precautions / Restrictions Precautions Precautions: Fall Precaution Comments: self reported history of falls Restrictions Weight Bearing Restrictions: No      Mobility Bed Mobility Overal bed mobility: Needs Assistance Bed Mobility: Supine to Sit;Sit to Supine     Supine to sit: Supervision (for safety with increased time) Sit to supine: Supervision   General bed mobility comments: increased time, Pt able to scoot up in the bed as well with no physical assist  Transfers Overall transfer level: Needs assistance Equipment used: None Transfers: Stand Pivot Transfers;Sit to/from Stand Sit to Stand: Min guard;Min assist Stand pivot transfers: Min guard       General transfer comment: Pt needed assist for stand to sit for safety    Balance Overall  balance assessment: Needs assistance Sitting-balance support: No upper extremity supported;Feet supported Sitting balance-Leahy Scale: Good     Standing balance support: Bilateral upper extremity supported;During functional activity Standing balance-Leahy Scale: Poor                              ADL Overall ADL's : Needs assistance/impaired Eating/Feeding: NPO Eating/Feeding Details (indicate cue type and reason): text paged SLP so Pt could be evaluated for eating Grooming: Min guard;Standing   Upper Body Bathing: Min guard;Sitting Upper Body Bathing Details (indicate cue type and reason): educated on 3 in 1 as shower chair for safety Lower Body Bathing: Supervison/ safety;Sit to/from stand Lower Body Bathing Details (indicate cue type and reason): educated on 3 in 1 in shower for safety     Lower Body Dressing: Supervision/safety;Sitting/lateral leans Lower Body Dressing Details (indicate cue type and reason): Pt able to don/doff both socks without assistance. Increase time required Toilet Transfer: Min Public librarian Details (indicate cue type and reason): "I like how it has the arm rests to help me up and down, I feel safer"                 Vision     Perception     Praxis      Pertinent Vitals/Pain Pain Assessment: Faces Pain Score: 10-Worst pain ever Faces Pain Scale: Hurts little more Pain Location: back, legs, and hands Pain Descriptors / Indicators: Sore Pain Intervention(s): Limited activity within patient's tolerance;Monitored during session;Repositioned     Hand Dominance     Extremity/Trunk  Assessment Upper Extremity Assessment Upper Extremity Assessment: Overall WFL for tasks assessed   Lower Extremity Assessment Lower Extremity Assessment: Overall WFL for tasks assessed   Cervical / Trunk Assessment Cervical / Trunk Assessment: Normal   Communication Communication Communication: No difficulties    Cognition Arousal/Alertness: Awake/alert Behavior During Therapy: WFL for tasks assessed/performed Overall Cognitive Status: Within Functional Limits for tasks assessed                     General Comments       Exercises       Shoulder Instructions      Home Living Family/patient expects to be discharged to:: Private residence Living Arrangements: Non-relatives/Friends (Roommate "Earlie Server" is CNA) Available Help at Discharge: Friend(s);Family;Available 24 hours/day (son local) Type of Home: Apartment Home Access: Stairs to enter Entrance Stairs-Number of Steps: 3 Entrance Stairs-Rails: Left Home Layout: One level     Bathroom Shower/Tub: Tub/shower unit Shower/tub characteristics: Architectural technologist: Standard Bathroom Accessibility: Yes How Accessible: Accessible via walker Home Equipment: None   Additional Comments: "Getting up and down is the hardest part"      Prior Functioning/Environment Level of Independence: Independent        Comments: reports recent falls x 1 week; previous fall "months ago"        OT Problem List: Impaired balance (sitting and/or standing);Decreased strength;Decreased activity tolerance;Decreased knowledge of use of DME or AE;Decreased safety awareness;Pain (Chronic pain)   OT Treatment/Interventions:      OT Goals(Current goals can be found in the care plan section) Acute Rehab OT Goals Patient Stated Goal: to get something to eat and get 3 in 1 for home OT Goal Formulation: With patient Time For Goal Achievement: 12/05/15 Potential to Achieve Goals: Good  OT Frequency:     Barriers to D/C:            Co-evaluation              End of Session Equipment Utilized During Treatment:  (None) Nurse Communication: Mobility status  Activity Tolerance: Patient tolerated treatment well Patient left: in bed;with call bell/phone within reach;Other (comment) (with SLP entering the room)   Time: [id number] OT Time  Calculation (min): 16 min Charges:  OT General Charges $OT Visit: 1 Procedure OT Evaluation $OT Eval Moderate Complexity: 1 Procedure G-Codes: OT G-codes **NOT FOR INPATIENT CLASS** Functional Assessment Tool Used: Clinical Judgement Functional Limitation: Self care Self Care Current Status [id number]): At least 20 percent but less than 40 percent impaired, limited or restricted Self Care Goal Status [id number]): At least 1 percent but less than 20 percent impaired, limited or restricted Self Care Discharge Status [phone]): At least 20 percent but less than 40 percent impaired, limited or restricted  Jaci Carrel 11/28/2015, 11:39 AM Hulda Humphrey OTR/L [phone]

## 2015-11-28 NOTE — Care Management Note (Signed)
Case Management Note  Patient Details  Name: [name] [name] MRN: [id number] Date of Birth: 07-Nov-1950  Subjective/Objective:   Pt in with a fall. She is from home with a roommate.                  Action/Plan: Awaiting PT/OT recommendations. CM following for discharge needs.   Expected Discharge Date:                  Expected Discharge Plan:     In-House Referral:     Discharge planning Services     Post Acute Care Choice:    Choice offered to:     DME Arranged:    DME Agency:     HH Arranged:    HH Agency:     Status of Service:  In process, will continue to follow  If discussed at Long Length of Stay Meetings, dates discussed:    Additional Comments:  Pollie Friar, RN 11/28/2015, 10:19 AM

## 2015-11-28 NOTE — Care Management Note (Signed)
Case Management Note  Patient Details  Name: [name] [name] MRN: [id number] Date of Birth: 21-May-1950  Subjective/Objective:                    Action/Plan: Pt discharging home with orders for 3 in 1 and rollator. Larene Beach with [facility] DME notified and will deliver the equipment to the room. PT recommended HH PT. CM paged MD and awaiting response. Pt would like to use [facility] if orders placed.   Expected Discharge Date:                  Expected Discharge Plan:  Home/Self Care  In-House Referral:     Discharge planning Services  CM Consult  Post Acute Care Choice:  Durable Medical Equipment Choice offered to:  Patient  DME Arranged:  3-N-1, Walker rolling with seat DME Agency:  [place]:    [place] Agency:     Status of Service:  Completed, signed off  If discussed at [place] of Stay Meetings, dates discussed:    Additional Comments:  Pollie Friar, RN 11/28/2015, 4:48 PM

## 2015-11-28 NOTE — Care Management Obs Status (Signed)
[place] NOTIFICATION   Patient Details  Name: [name] [name] MRN: [id number] Date of Birth: 1950/11/21   Medicare Observation Status Notification Given:  Yes    Pollie Friar, RN 11/28/2015, 12:43 PM

## 2015-11-28 NOTE — Evaluation (Signed)
Physical Therapy Evaluation Patient Details Name: [name] [name] MRN: [id number] DOB: 27-Sep-1950 Today's Date: 11/28/2015   History of Present Illness  65 y/o female presenting in ED with altered mental status and fall at home. Pt  has a past medical history of Allergy; Anxiety; Arthritis; Depression; Fibromyalgia; Fibromyalgia; GERD; bronchitis; Hypertension; Osteoporosis; Seizures; Sleep apnea; Stroke(2015); and Substance abuse.  Clinical Impression  Pt admitted with above symptoms. Patient initially too painful and tearful, however was able to progress to walking 15 ft and stood at sink  x4 minutes (at times with no UE support up to 1 minute). Bil LE strength at least 4/5 with manual testing and demonstrated functionally. Pt currently with functional limitations due to the deficits listed below (see PT Problem List). Pt my benefit from skilled PT to increase their independence and safety with mobility to allow discharge to the venue listed below.       Follow Up Recommendations Home health PT (safety evaluation and continued education on use of DME); Supervision for mobility/OOB    Equipment Recommendations  Rolling walker with 5" wheels    Recommendations for Other Services       Precautions / Restrictions Precautions Precautions: Fall Precaution Comments: self reported history of falls Restrictions Weight Bearing Restrictions: No      Mobility  Bed Mobility Overal bed mobility: Modified Independent Bed Mobility: Supine to Sit;Sit to Supine     Supine to sit: Modified independent (Device/Increase time) (with increased time, HOB 0, no rail) Sit to supine: Modified independent (Device/Increase time)   General bed mobility comments: increased time, Pt able to scoot up in the bed as well with no physical assist  Transfers Overall transfer level: Needs assistance Equipment used: Rolling walker (2 wheeled) Transfers: Sit to/from Stand Sit to Stand: Min guard Stand pivot  transfers: Min guard       General transfer comment: vc for proper use of RW; closeguarding for safety; incr effort moving hands from EOB to RW  Ambulation/Gait Ambulation/Gait assistance: Min guard;Min assist Ambulation Distance (Feet): 15 Feet Assistive device: Rolling walker (2 wheeled) Gait Pattern/deviations: Step-through pattern;Decreased stride length (knees slightly flexed)   Gait velocity interpretation: Below normal speed for age/gender General Gait Details: no unsteadiness, however incr reliance on UEs on RW; no knee instability noted, in fact she was able to walk with slight knee flexion  Stairs         General stair comments: Discussed possible w/c and need for 2 person assist (pt refused); educted on turning sideways to face single rail and put both hands on rail--pt feels this technique will work. she is certain she can get up her steps  Wheelchair Mobility    Modified Rankin (Stroke Patients Only)       Balance Overall balance assessment: Needs assistance Sitting-balance support: No upper extremity supported;Feet unsupported Sitting balance-Leahy Scale: Good     Standing balance support: No upper extremity supported;During functional activity Standing balance-Leahy Scale: Fair Standing balance comment: stood at sink x 4 minutes with bimanual tasks (washing, doing hair)                             Pertinent Vitals/Pain Pain Assessment: 0-10 Pain Score: 10-Worst pain ever Faces Pain Scale: Hurts little more Pain Location: back and legs Pain Descriptors / Indicators: Aching Pain Intervention(s): Limited activity within patient's tolerance;Monitored during session;Patient requesting pain meds-RN notified;Repositioned    Home Living Family/patient expects to be discharged to:: Private  residence Living Arrangements: Non-relatives/Friends (Roommate "Earlie Server" is CNA) Available Help at Discharge: Friend(s);Family;Available 24 hours/day (son  local) Type of Home: Apartment Home Access: Stairs to enter Entrance Stairs-Rails: Left Entrance Stairs-Number of Steps: 3 Home Layout: One level Home Equipment: None Additional Comments: "Getting up and down is the hardest part"    Prior Function Level of Independence: Independent         Comments: reports recent falls x 1 week; previous fall "months ago"     Hand Dominance        Extremity/Trunk Assessment   Upper Extremity Assessment: Overall WFL for tasks assessed           Lower Extremity Assessment: RLE deficits/detail;LLE deficits/detail RLE Deficits / Details: AROM WFL (slow); ankle DF 4/5, knee extension 4/5 LLE Deficits / Details: AROM WFL (slow); ankle DF 4/5, knee extension 4/5  Cervical / Trunk Assessment: Normal  Communication   Communication: No difficulties  Cognition Arousal/Alertness: Awake/alert Behavior During Therapy: Anxious (tearful; wants to go home) Overall Cognitive Status: Within Functional Limits for tasks assessed                      General Comments      Exercises     Assessment/Plan    PT Assessment Patient needs continued PT services  PT Problem List Decreased strength;Decreased activity tolerance;Decreased mobility;Decreased knowledge of use of DME;Impaired sensation;Pain          PT Treatment Interventions DME instruction;Gait training;Stair training;Functional mobility training;Therapeutic activities;Therapeutic exercise;Balance training;Patient/family education    PT Goals (Current goals can be found in the Care Plan section)  Acute Rehab PT Goals Patient Stated Goal: to get something to eat and go home PT Goal Formulation: With patient Time For Goal Achievement: 12/02/15 Potential to Achieve Goals: Good    Frequency Min 3X/week   Barriers to discharge        Co-evaluation               End of Session Equipment Utilized During Treatment: Gait belt Activity Tolerance: Patient limited by pain  (tearful at times) Patient left: in bed;with call bell/phone within reach;with bed alarm set Nurse Communication: Mobility status;Other (comment) (4/5 leg strength; minguard assist)    Functional Assessment Tool Used: clinical judgement Functional Limitation: Mobility: Walking and moving around Mobility: Walking and Moving Around Current Status [phone]): At least 1 percent but less than 20 percent impaired, limited or restricted Mobility: Walking and Moving Around Goal Status [phone]): At least 1 percent but less than 20 percent impaired, limited or restricted    Time: 0951-1035 PT Time Calculation (min) (ACUTE ONLY): 44 min   Charges:   PT Evaluation $PT Eval Moderate Complexity: 1 Procedure PT Treatments $Gait Training: 8-22 mins $Therapeutic Activity: 8-22 mins   PT G Codes:   PT G-Codes **NOT FOR INPATIENT CLASS** Functional Assessment Tool Used: clinical judgement Functional Limitation: Mobility: Walking and moving around Mobility: Walking and Moving Around Current Status [id number]): At least 1 percent but less than 20 percent impaired, limited or restricted Mobility: Walking and Moving Around Goal Status [phone]): At least 1 percent but less than 20 percent impaired, limited or restricted    Rexanne Mano 11/28/2015, 1:13 PM Pager [phone]

## 2015-11-28 NOTE — Progress Notes (Signed)
Patient was discharged home. Discharge instructions were reviewed with the patient. Patient verbalized understand.  Arta Silence, RN  11/28/2015 7:02 PM

## 2015-11-29 NOTE — Progress Notes (Signed)
SLP addendum    11/28/15 1050  SLP G-Codes **NOT FOR INPATIENT CLASS**  Functional Assessment Tool Used skilled clinical judgement  Functional Limitations Swallowing  Swallow Current Status [id number]) [name]  Swallow Goal Status [id number]) [name]  Swallow Discharge Status [id number]) [name]  SLP Evaluations  $ SLP Speech Visit 1 Procedure  SLP Evaluations  $BSS Swallow 1 Procedure    [name] [name] M.Ed [employer] [phone]

## 2015-11-30 LAB — LEVETIRACETAM LEVEL: LEVETIRACETAM: 18.7 ug/mL (ref 10.0–40.0)

## 2015-12-12 ENCOUNTER — Observation Stay (HOSPITAL_COMMUNITY)
Admission: EM | Admit: 2015-12-12 | Discharge: 2015-12-13 | Disposition: A | Discharge status: Home / Self Care | Payer: Medicare Other | Attending: Internal Medicine | Admitting: Internal Medicine

## 2015-12-12 ENCOUNTER — Encounter (HOSPITAL_COMMUNITY): Payer: Self-pay | Admitting: Emergency Medicine

## 2015-12-12 ENCOUNTER — Emergency Department (HOSPITAL_COMMUNITY): Payer: Medicare Other

## 2015-12-12 DIAGNOSIS — G4733 Obstructive sleep apnea (adult) (pediatric): Secondary | ICD-10-CM | POA: Insufficient documentation

## 2015-12-12 DIAGNOSIS — G40909 Epilepsy, unspecified, not intractable, without status epilepticus: Secondary | ICD-10-CM | POA: Insufficient documentation

## 2015-12-12 DIAGNOSIS — M810 Age-related osteoporosis without current pathological fracture: Secondary | ICD-10-CM | POA: Insufficient documentation

## 2015-12-12 DIAGNOSIS — M19042 Primary osteoarthritis, left hand: Secondary | ICD-10-CM | POA: Diagnosis not present

## 2015-12-12 DIAGNOSIS — K838 Other specified diseases of biliary tract: Secondary | ICD-10-CM | POA: Insufficient documentation

## 2015-12-12 DIAGNOSIS — J8410 Pulmonary fibrosis, unspecified: Secondary | ICD-10-CM | POA: Insufficient documentation

## 2015-12-12 DIAGNOSIS — G9340 Encephalopathy, unspecified: Secondary | ICD-10-CM | POA: Diagnosis not present

## 2015-12-12 DIAGNOSIS — Z8673 Personal history of transient ischemic attack (TIA), and cerebral infarction without residual deficits: Secondary | ICD-10-CM | POA: Diagnosis not present

## 2015-12-12 DIAGNOSIS — M4690 Unspecified inflammatory spondylopathy, site unspecified: Secondary | ICD-10-CM | POA: Diagnosis not present

## 2015-12-12 DIAGNOSIS — M542 Cervicalgia: Secondary | ICD-10-CM | POA: Insufficient documentation

## 2015-12-12 DIAGNOSIS — Z820 Family history of epilepsy and other diseases of the nervous system: Secondary | ICD-10-CM | POA: Insufficient documentation

## 2015-12-12 DIAGNOSIS — I10 Essential (primary) hypertension: Secondary | ICD-10-CM | POA: Insufficient documentation

## 2015-12-12 DIAGNOSIS — Z79899 Other long term (current) drug therapy: Secondary | ICD-10-CM | POA: Insufficient documentation

## 2015-12-12 DIAGNOSIS — F1210 Cannabis abuse, uncomplicated: Secondary | ICD-10-CM | POA: Insufficient documentation

## 2015-12-12 DIAGNOSIS — W19XXXA Unspecified fall, initial encounter: Secondary | ICD-10-CM | POA: Insufficient documentation

## 2015-12-12 DIAGNOSIS — T424X1A Poisoning by benzodiazepines, accidental (unintentional), initial encounter: Secondary | ICD-10-CM | POA: Diagnosis not present

## 2015-12-12 DIAGNOSIS — F329 Major depressive disorder, single episode, unspecified: Secondary | ICD-10-CM | POA: Insufficient documentation

## 2015-12-12 DIAGNOSIS — M48061 Spinal stenosis, lumbar region without neurogenic claudication: Secondary | ICD-10-CM | POA: Insufficient documentation

## 2015-12-12 DIAGNOSIS — R9431 Abnormal electrocardiogram [ECG] [EKG]: Secondary | ICD-10-CM | POA: Diagnosis present

## 2015-12-12 DIAGNOSIS — M549 Dorsalgia, unspecified: Secondary | ICD-10-CM | POA: Diagnosis not present

## 2015-12-12 DIAGNOSIS — Z9114 Patient's other noncompliance with medication regimen: Secondary | ICD-10-CM | POA: Insufficient documentation

## 2015-12-12 DIAGNOSIS — E871 Hypo-osmolality and hyponatremia: Secondary | ICD-10-CM | POA: Insufficient documentation

## 2015-12-12 DIAGNOSIS — F17210 Nicotine dependence, cigarettes, uncomplicated: Secondary | ICD-10-CM | POA: Insufficient documentation

## 2015-12-12 DIAGNOSIS — M19041 Primary osteoarthritis, right hand: Secondary | ICD-10-CM | POA: Insufficient documentation

## 2015-12-12 DIAGNOSIS — T481X1A Poisoning by skeletal muscle relaxants [neuromuscular blocking agents], accidental (unintentional), initial encounter: Secondary | ICD-10-CM | POA: Insufficient documentation

## 2015-12-12 DIAGNOSIS — Z800 Family history of malignant neoplasm of digestive organs: Secondary | ICD-10-CM | POA: Insufficient documentation

## 2015-12-12 DIAGNOSIS — M5126 Other intervertebral disc displacement, lumbar region: Secondary | ICD-10-CM | POA: Insufficient documentation

## 2015-12-12 DIAGNOSIS — G8929 Other chronic pain: Secondary | ICD-10-CM | POA: Diagnosis present

## 2015-12-12 DIAGNOSIS — F419 Anxiety disorder, unspecified: Secondary | ICD-10-CM | POA: Insufficient documentation

## 2015-12-12 DIAGNOSIS — Z9049 Acquired absence of other specified parts of digestive tract: Secondary | ICD-10-CM | POA: Insufficient documentation

## 2015-12-12 DIAGNOSIS — T50904A Poisoning by unspecified drugs, medicaments and biological substances, undetermined, initial encounter: Secondary | ICD-10-CM

## 2015-12-12 DIAGNOSIS — M4802 Spinal stenosis, cervical region: Secondary | ICD-10-CM | POA: Insufficient documentation

## 2015-12-12 DIAGNOSIS — K219 Gastro-esophageal reflux disease without esophagitis: Secondary | ICD-10-CM | POA: Insufficient documentation

## 2015-12-12 DIAGNOSIS — Z823 Family history of stroke: Secondary | ICD-10-CM | POA: Insufficient documentation

## 2015-12-12 DIAGNOSIS — R400 Somnolence: Secondary | ICD-10-CM | POA: Diagnosis not present

## 2015-12-12 DIAGNOSIS — E876 Hypokalemia: Secondary | ICD-10-CM | POA: Diagnosis present

## 2015-12-12 DIAGNOSIS — T402X1A Poisoning by other opioids, accidental (unintentional), initial encounter: Principal | ICD-10-CM | POA: Insufficient documentation

## 2015-12-12 DIAGNOSIS — I700 Atherosclerosis of aorta: Secondary | ICD-10-CM | POA: Insufficient documentation

## 2015-12-12 DIAGNOSIS — F1290 Cannabis use, unspecified, uncomplicated: Secondary | ICD-10-CM | POA: Diagnosis present

## 2015-12-12 DIAGNOSIS — R569 Unspecified convulsions: Secondary | ICD-10-CM

## 2015-12-12 DIAGNOSIS — T50901A Poisoning by unspecified drugs, medicaments and biological substances, accidental (unintentional), initial encounter: Secondary | ICD-10-CM | POA: Diagnosis present

## 2015-12-12 DIAGNOSIS — K5730 Diverticulosis of large intestine without perforation or abscess without bleeding: Secondary | ICD-10-CM | POA: Insufficient documentation

## 2015-12-12 DIAGNOSIS — F1910 Other psychoactive substance abuse, uncomplicated: Secondary | ICD-10-CM | POA: Diagnosis present

## 2015-12-12 DIAGNOSIS — J439 Emphysema, unspecified: Secondary | ICD-10-CM | POA: Insufficient documentation

## 2015-12-12 DIAGNOSIS — R296 Repeated falls: Secondary | ICD-10-CM

## 2015-12-12 DIAGNOSIS — M797 Fibromyalgia: Secondary | ICD-10-CM | POA: Diagnosis not present

## 2015-12-12 DIAGNOSIS — T407X1A Poisoning by cannabis (derivatives), accidental (unintentional), initial encounter: Secondary | ICD-10-CM | POA: Diagnosis not present

## 2015-12-12 LAB — ACETAMINOPHEN LEVEL: Acetaminophen (Tylenol), Serum: <10 ug/mL — ABNORMAL LOW (ref 10–30)

## 2015-12-12 LAB — COMPREHENSIVE METABOLIC PANEL
ALT: 38 U/L (ref 14–54)
AST: 103 U/L — ABNORMAL HIGH (ref 15–41)
Albumin: 3.9 g/dL (ref 3.5–5.0)
Alkaline Phosphatase: 84 U/L (ref 38–126)
Anion gap: 9 (ref 5–15)
BUN: 12 mg/dL (ref 6–20)
CHLORIDE: 98 mmol/L — AB (ref 101–111)
CO2: 29 mmol/L (ref 22–32)
CREATININE: 0.99 mg/dL (ref 0.44–1.00)
Calcium: 9.1 mg/dL (ref 8.9–10.3)
GFR, EST NON AFRICAN AMERICAN: 59 mL/min — AB (ref >60)
Glucose, Bld: 106 mg/dL — ABNORMAL HIGH (ref 65–99)
Potassium: 2.4 mmol/L — CL (ref 3.5–5.1)
Sodium: 136 mmol/L (ref 135–145)
Total Bilirubin: 0.8 mg/dL (ref 0.3–1.2)
Total Protein: 6.5 g/dL (ref 6.5–8.1)

## 2015-12-12 LAB — CBC
HCT: 32.6 % — ABNORMAL LOW (ref 36.0–46.0)
Hemoglobin: 11.3 g/dL — ABNORMAL LOW (ref 12.0–15.0)
MCH: 29.9 pg (ref 26.0–34.0)
MCHC: 34.7 g/dL (ref 30.0–36.0)
MCV: 86.2 fL (ref 78.0–100.0)
PLATELETS: 286 10*3/uL (ref 150–400)
RBC: 3.78 MIL/uL — AB (ref 3.87–5.11)
RDW: 13.3 % (ref 11.5–15.5)
WBC: 4.3 10*3/uL (ref 4.0–10.5)

## 2015-12-12 LAB — ETHANOL

## 2015-12-12 LAB — RAPID URINE DRUG SCREEN, HOSP PERFORMED
Amphetamines: NOT DETECTED
Barbiturates: NOT DETECTED
Benzodiazepines: POSITIVE — AB
Cocaine: NOT DETECTED
OPIATES: POSITIVE — AB
Tetrahydrocannabinol: POSITIVE — AB

## 2015-12-12 LAB — CBG MONITORING, ED: Glucose-Capillary: 105 mg/dL — ABNORMAL HIGH (ref 65–99)

## 2015-12-12 LAB — SALICYLATE LEVEL

## 2015-12-12 LAB — MAGNESIUM: MAGNESIUM: 1.6 mg/dL — AB (ref 1.7–2.4)

## 2015-12-12 LAB — PREGNANCY, URINE: Preg Test, Ur: NEGATIVE

## 2015-12-12 MED ORDER — ALBUTEROL SULFATE (2.5 MG/3ML) 0.083% IN NEBU: 2.5000 mg | INHALATION_SOLUTION | RESPIRATORY_TRACT | Status: DC | PRN

## 2015-12-12 MED ORDER — NALOXONE HCL 0.4 MG/ML IJ SOLN
0.2000 mg | Freq: Once | INTRAMUSCULAR | Status: AC
  Administered 2015-12-12: 0.2 mg via INTRAVENOUS
  Filled 2015-12-12: qty 1

## 2015-12-12 MED ORDER — ENOXAPARIN SODIUM 40 MG/0.4ML ~~LOC~~ SOLN
40.0000 mg | SUBCUTANEOUS | Status: DC
  Filled 2015-12-12: qty 0.4

## 2015-12-12 MED ORDER — MAGNESIUM SULFATE 2 GM/50ML IV SOLN
2.0000 g | Freq: Once | INTRAVENOUS | Status: AC
  Administered 2015-12-12: 2 g via INTRAVENOUS
  Filled 2015-12-12: qty 50

## 2015-12-12 MED ORDER — SODIUM CHLORIDE 0.9 % IV SOLN
250.0000 mg | Freq: Two times a day (BID) | INTRAVENOUS | Status: DC
  Administered 2015-12-13: 250 mg via INTRAVENOUS
  Filled 2015-12-12 (×2): qty 2.5

## 2015-12-12 MED ORDER — ACETAMINOPHEN 650 MG RE SUPP: 650.0000 mg | Freq: Four times a day (QID) | RECTAL | Status: DC | PRN

## 2015-12-12 MED ORDER — SODIUM CHLORIDE 0.9 % IV SOLN
30.0000 meq | Freq: Once | INTRAVENOUS | Status: AC
  Administered 2015-12-12: 30 meq via INTRAVENOUS
  Filled 2015-12-12: qty 15

## 2015-12-12 MED ORDER — ACETAMINOPHEN 325 MG PO TABS
650.0000 mg | ORAL_TABLET | Freq: Four times a day (QID) | ORAL | Status: DC | PRN
  Filled 2015-12-12: qty 2

## 2015-12-12 MED ORDER — SODIUM CHLORIDE 0.9 % IV SOLN
30.0000 meq | Freq: Once | INTRAVENOUS | Status: AC
  Administered 2015-12-13: 30 meq via INTRAVENOUS
  Filled 2015-12-12: qty 15

## 2015-12-12 MED ORDER — NALOXONE HCL 0.4 MG/ML IJ SOLN
0.2000 mg | Freq: Once | INTRAMUSCULAR | Status: AC
  Administered 2015-12-12: 0.2 mg via INTRAVENOUS

## 2015-12-12 NOTE — ED Provider Notes (Signed)
[facility] DEPT Provider Note   CSN: [id number] Arrival date & time: 12/12/15  1808     History   Chief Complaint Chief Complaint  Patient presents with  . Drug Overdose  . Fall     HPI   Blood pressure 111/66, pulse 88, temperature 97.7 F (36.5 C), temperature source Oral, resp. rate 16, SpO2 100 %.  [name] [name] is a 65 y.o. female h/o chronic pain, fibromyalgia, osteoporosis, anxiety, depression, HTN, stroke with aphasia, substance abuse, seizures brought in by EMS for altered mental status/lethargy called by patient's roommate, on scene patient is interactive, she states that she had a fall and is complaining of posterior neck and upper back pain. She's not anticoagulated. Patient is somnolent on exam and refusing to talk. Level V caveat secondary to altered mental status. Per EMS, patient took 2x 15 mg oxycodone, 1x 30 mg flexeril, 2x 1 mg xanax, 3x joints of marijuana for pain management. Patient denies suicidal ideation, homicidal ideation, auditory or visual hallucinations, suicide attempt to EMS.  Past Medical History:  Diagnosis Date  . Allergy   . Anxiety   . Arthritis    lower back, hands  . Depression   . Fibromyalgia   . Fibromyalgia   . GERD (gastroesophageal reflux disease)   . Hx of bronchitis   . Hypertension   . Osteoporosis   . Seizures ([place])    last one 05/2015 - on Keppra   . Sleep apnea    Does not use CPAP  . Stroke [facility]) 2015   some aphasia  . Substance abuse     Patient Active Problem List   Diagnosis Date Noted  . Prolonged Q-T interval on ECG 12/12/2015  . Fall 11/27/2015  . Generalized weakness 11/27/2015  . Hyponatremia 11/27/2015  . Frequent falls   . Altered mental status 08/19/2013  . Seizures ([place]) 08/19/2013  . Aphasia complicating stroke ([place]) 08/19/2013  . Essential hypertension, benign 08/19/2013  . Hypokalemia 08/19/2013  . Marijuana use 08/19/2013  . Fibromyalgia 08/19/2013    Past Surgical History:    Procedure Laterality Date  . BREAST LUMPECTOMY Right    benign  . CHOLECYSTECTOMY    . TONSILLECTOMY    . WISDOM TOOTH EXTRACTION      OB History    No data available       Home Medications    Prior to Admission medications   Medication Sig Start Date End Date Taking? Authorizing Provider  ALPRAZolam Duanne Moron) 0.5 MG tablet Take 1 tablet (0.5 mg total) by mouth daily as needed for anxiety. Patient not taking: Reported on 11/27/2015 08/22/13   Velta Addison Mikhail, DO  ALPRAZolam Duanne Moron) 1 MG tablet Take 0.5 mg by mouth 3 (three) times daily as needed for anxiety.    Historical Provider, MD  citalopram (CELEXA) 10 MG tablet Take 1 tablet (10 mg total) by mouth daily. Patient not taking: Reported on 11/27/2015 08/22/13   Velta Addison Mikhail, DO  gabapentin (NEURONTIN) 100 MG capsule Take 1 capsule (100 mg total) by mouth 3 (three) times daily. Patient not taking: Reported on 11/27/2015 08/22/13   Velta Addison Mikhail, DO  levETIRAcetam (KEPPRA) 250 MG tablet Take 1 tablet (250 mg total) by mouth 2 (two) times daily. 08/22/13   Maryann Mikhail, DO  omeprazole (PRILOSEC) 40 MG capsule Take 40 mg by mouth daily.    Historical Provider, MD  oxyCODONE (ROXICODONE) 15 MG immediate release tablet Take 15 mg by mouth every 8 (eight) hours as needed for pain. Max of  4 tablets daily    Historical Provider, MD  potassium chloride (K-DUR) 10 MEQ tablet Take 1 tablet (10 mEq total) by mouth daily. Patient not taking: Reported on 11/27/2015 08/22/13   Cristal Ford, DO    Family History Family History  Problem Relation Age of Onset  . Alzheimer's disease Mother   . Stroke Father   . Esophageal cancer Maternal Aunt   . Colon cancer Neg Hx   . Rectal cancer Neg Hx   . Stomach cancer Neg Hx     Social History Social History  Substance Use Topics  . Smoking status: Current Every Day Smoker    Packs/day: 0.50    Years: 25.00    Types: Cigarettes  . Smokeless tobacco: Never Used  . Alcohol use No      Allergies   Tetracyclines & related   Review of Systems Review of Systems  10 systems reviewed and found to be negative, except as noted in the HPI.  Physical Exam Updated Vital Signs BP (!) 142/103 (BP Location: Left Arm)   Pulse 92   Temp 97.7 F (36.5 C) (Oral)   Resp 23   SpO2 100%   Physical Exam  Constitutional: She appears well-developed and well-nourished. No distress.  Somnolent, C collar in place  HENT:  Head: Normocephalic and atraumatic.  Mouth/Throat: Oropharynx is clear and moist.  Eyes: Conjunctivae and EOM are normal. Pupils are equal, round, and reactive to light.  Neck: Normal range of motion.  Cardiovascular: Normal rate, regular rhythm and intact distal pulses.  Exam reveals no gallop and no friction rub.   No murmur heard. Pulmonary/Chest: Effort normal and breath sounds normal. No respiratory distress. She has no wheezes. She has no rales. She exhibits no tenderness.  Abdominal: Soft. She exhibits no distension. There is no tenderness. There is no guarding.  Musculoskeletal: Normal range of motion.  Neurological:  Somnolent, responsive to noxious stimuli, pupils equal round and reactive  Skin: She is not diaphoretic.  Psychiatric: She has a normal mood and affect.  Nursing note and vitals reviewed.    ED Treatments / Results  Labs (all labs ordered are listed, but only abnormal results are displayed) Labs Reviewed  COMPREHENSIVE METABOLIC PANEL - Abnormal; Notable for the following:       Result Value   Potassium 2.4 (*)    Chloride 98 (*)    Glucose, Bld 106 (*)    AST 103 (*)    GFR calc non Af Amer 59 (*)    All other components within normal limits  ACETAMINOPHEN LEVEL - Abnormal; Notable for the following:    Acetaminophen (Tylenol), Serum <10 (*)    All other components within normal limits  CBC - Abnormal; Notable for the following:    RBC 3.78 (*)    Hemoglobin 11.3 (*)    HCT 32.6 (*)    All other components within  normal limits  RAPID URINE DRUG SCREEN, HOSP PERFORMED - Abnormal; Notable for the following:    Opiates POSITIVE (*)    Benzodiazepines POSITIVE (*)    Tetrahydrocannabinol POSITIVE (*)    All other components within normal limits  MAGNESIUM - Abnormal; Notable for the following:    Magnesium 1.6 (*)    All other components within normal limits  CBG MONITORING, ED - Abnormal; Notable for the following:    Glucose-Capillary 105 (*)    All other components within normal limits  ETHANOL  SALICYLATE LEVEL  PREGNANCY, URINE  EKG  EKG Interpretation  Date/Time:  Monday December 12 2015 18:21:15 EST Ventricular Rate:  89 PR Interval:    QRS Duration: 85 QT Interval:  428 QTC Calculation: 521 R Axis:   7 Text Interpretation:  Sinus rhythm LVH by voltage Borderline T abnormalities, diffuse leads Prolonged QT interval Confirmed by Alvino Chapel  MD, NATHAN [phone]) on 12/12/2015 7:59:13 PM       Radiology Ct Head Wo Contrast  Result Date: 12/12/2015 CLINICAL DATA:  Status post fall with lateral neck and upper back pain. EXAM: CT HEAD WITHOUT CONTRAST CT CERVICAL SPINE WITHOUT CONTRAST TECHNIQUE: Multidetector CT imaging of the head and cervical spine was performed following the standard protocol without intravenous contrast. Multiplanar CT image reconstructions of the cervical spine were also generated. COMPARISON:  Head and cervical spine CT scans 11/27/2015. FINDINGS: CT HEAD FINDINGS Brain: Appears normal without hemorrhage, infarct, mass lesion, mass effect, midline shift or abnormal extra-axial fluid collection. No hydrocephalus or pneumocephalus. Vascular: Atherosclerosis noted. Skull: Intact. Sinuses/Orbits: Negative. Other: None. CT CERVICAL SPINE FINDINGS Alignment: Normal. Skull base and vertebrae: No acute fracture. No primary bone lesion or focal pathologic process. Soft tissues and spinal canal: No prevertebral fluid or swelling. No visible canal hematoma. Disc levels:  Intervertebral disc space height is maintained. There is scattered mild to moderate facet degenerative change most notable on the right at C4-5. Upper chest: Emphysematous change is identified. Other: Atherosclerosis noted. IMPRESSION: No acute abnormality head or cervical spine. Atherosclerosis. Emphysema. Electronically Signed   By: Inge Rise M.D.   On: 12/12/2015 19:47   Ct Cervical Spine Wo Contrast  Result Date: 12/12/2015 CLINICAL DATA:  Status post fall with lateral neck and upper back pain. EXAM: CT HEAD WITHOUT CONTRAST CT CERVICAL SPINE WITHOUT CONTRAST TECHNIQUE: Multidetector CT imaging of the head and cervical spine was performed following the standard protocol without intravenous contrast. Multiplanar CT image reconstructions of the cervical spine were also generated. COMPARISON:  Head and cervical spine CT scans 11/27/2015. FINDINGS: CT HEAD FINDINGS Brain: Appears normal without hemorrhage, infarct, mass lesion, mass effect, midline shift or abnormal extra-axial fluid collection. No hydrocephalus or pneumocephalus. Vascular: Atherosclerosis noted. Skull: Intact. Sinuses/Orbits: Negative. Other: None. CT CERVICAL SPINE FINDINGS Alignment: Normal. Skull base and vertebrae: No acute fracture. No primary bone lesion or focal pathologic process. Soft tissues and spinal canal: No prevertebral fluid or swelling. No visible canal hematoma. Disc levels: Intervertebral disc space height is maintained. There is scattered mild to moderate facet degenerative change most notable on the right at C4-5. Upper chest: Emphysematous change is identified. Other: Atherosclerosis noted. IMPRESSION: No acute abnormality head or cervical spine. Atherosclerosis. Emphysema. Electronically Signed   By: Inge Rise M.D.   On: 12/12/2015 19:47    Procedures Procedures (including critical care time)  Medications Ordered in ED Medications  potassium chloride 30 mEq in sodium chloride 0.9 % 265 mL (KCL  MULTIRUN) IVPB (not administered)  magnesium sulfate IVPB 2 g 50 mL (2 g Intravenous New Bag/Given 12/12/15 2011)  naloxone [facility]) injection 0.2 mg (0.2 mg Intravenous Given 12/12/15 2009)  naloxone [facility]) injection 0.2 mg (0.2 mg Intravenous Given 12/12/15 2011)     Initial Impression / Assessment and Plan / ED Course  I have reviewed the triage vital signs and the nursing notes.  Pertinent labs & imaging results that were available during my care of the patient were reviewed by me and considered in my medical decision making (see chart for details).  Clinical Course  Vitals:   12/12/15 1933 12/12/15 1941 12/12/15 2027 12/12/15 2031  BP: 115/67 115/67  (!) 142/103  Pulse: 84 84 92 92  Resp: 14 15  23   Temp:      TempSrc:      SpO2: 92% 92%  100%    Medications  potassium chloride 30 mEq in sodium chloride 0.9 % 265 mL (KCL MULTIRUN) IVPB (not administered)  magnesium sulfate IVPB 2 g 50 mL (2 g Intravenous New Bag/Given 12/12/15 2011)  naloxone [facility]) injection 0.2 mg (0.2 mg Intravenous Given 12/12/15 2009)  naloxone [facility]) injection 0.2 mg (0.2 mg Intravenous Given 12/12/15 2011)    Eleisha Nobel is 65 y.o. female presenting with Somnolence, overdose likely recreational in nature. She took oxycodone, Flexeril, Xanax and smokes several marijuana gel. She reported a fall to EMS. She is quite somnolent on my exam, gag reflex intact. No indication for intubation. Saturating well on room air, lung sounds clear.  CT head and neck negative, rigid c-collar removed, 0.64 of Narcan given and patient is more responsive but confused and states that she is living in [place] and that she was playing with kids just prior to arrival. She denies any suicide attempts.  Potassium is low at 2.4, magnesium is also low at 1.6, will be repleted via IV. Prolonged QT on EKG of 541msWill need admission and possible psychiatric consult when she sobers up. Patient data to Dr. Tamala Julian    Final  Clinical Impressions(s) / ED Diagnoses   Final diagnoses:  Hypokalemia  Hypomagnesemia  Drug overdose, undetermined intent, initial encounter  Prolonged Q-T interval on ECG    New Prescriptions New Prescriptions   No medications on file     Monico Blitz, PA-C 12/12/15 2055    Monico Blitz, PA-C 12/12/15 2055    Davonna Belling, MD 12/12/15 2359

## 2015-12-12 NOTE — H&P (Signed)
History and Physical    [name] [name] [id number] DOB: 06-Dec-1950 DOA: 12/12/2015  Referring MD/NP/PA: Monico Blitz, PA-C PCP: Bartholome Bill, MD  Patient coming from: Home  Chief Complaint: Drug overdose  HPI: [name] [name] is a 65 y.o. female with medical history significant of HTN, anxiety, depression, fibromyalgia, CVA, seizure, marijuana abuse, and tobacco abuse, OSA, ; who apparently took two 15 mg oxycodone, one 30 mg Flexeril, two 1 mg Xanax, and smoked 3 joints of marijuana for symptom management  after a recent fall. History is mostly obtained via report as patient still very somnolent. Patient complained of significant neck and upper back pain. On review of records patient had been admitted to the hospital previously on 11/19 through 11/24 generalized weakness and falls that was thought to be related to deconditioning and the patient had been given a walker. Unsure at this time if the patient is utilizing a walker. Denies any suicidal ideation.  ED Course: Patient was evaluated with CT of the head and neck which was negative.  Blood work revealed potassium 2.4 and magnesium 1.6. She was given 2 g of magnesium sulfate IV and 30 mEq of potassium chloride IV. UDS positive for opiates, benzodiazepines,and marijuana. Patient was also given 1 dose of Narcan and noted to be more responsive, but still confused.  Review of Systems:  unable to be obtained secondary to somnolence  Past Medical History:  Diagnosis Date  . Allergy   . Anxiety   . Arthritis    lower back, hands  . Depression   . Fibromyalgia   . Fibromyalgia   . GERD (gastroesophageal reflux disease)   . Hx of bronchitis   . Hypertension   . Osteoporosis   . Seizures ([place])    last one 05/2015 - on Keppra   . Sleep apnea    Does not use CPAP  . Stroke [facility]) 2015   some aphasia  . Substance abuse     Past Surgical History:  Procedure Laterality Date  . BREAST LUMPECTOMY Right    benign  .  CHOLECYSTECTOMY    . TONSILLECTOMY    . WISDOM TOOTH EXTRACTION       reports that she has been smoking Cigarettes.  She has a 12.50 pack-year smoking history. She has never used smokeless tobacco. She reports that she uses drugs, including Marijuana. She reports that she does not drink alcohol.  Allergies  Allergen Reactions  . Tetracyclines & Related Other (See Comments)    Causes pains    Family History  Problem Relation Age of Onset  . Alzheimer's disease Mother   . Stroke Father   . Esophageal cancer Maternal Aunt   . Colon cancer Neg Hx   . Rectal cancer Neg Hx   . Stomach cancer Neg Hx     Prior to Admission medications   Medication Sig Start Date End Date Taking? Authorizing Provider  ALPRAZolam Duanne Moron) 0.5 MG tablet Take 1 tablet (0.5 mg total) by mouth daily as needed for anxiety. Patient not taking: Reported on 11/27/2015 08/22/13   Velta Addison Mikhail, DO  ALPRAZolam Duanne Moron) 1 MG tablet Take 0.5 mg by mouth 3 (three) times daily as needed for anxiety.    Historical Provider, MD  citalopram (CELEXA) 10 MG tablet Take 1 tablet (10 mg total) by mouth daily. Patient not taking: Reported on 11/27/2015 08/22/13   Velta Addison Mikhail, DO  gabapentin (NEURONTIN) 100 MG capsule Take 1 capsule (100 mg total) by mouth 3 (three) times daily. Patient not  taking: Reported on 11/27/2015 08/22/13   Maryann Mikhail, DO  levETIRAcetam (KEPPRA) 250 MG tablet Take 1 tablet (250 mg total) by mouth 2 (two) times daily. 08/22/13   Maryann Mikhail, DO  omeprazole (PRILOSEC) 40 MG capsule Take 40 mg by mouth daily.    Historical Provider, MD  oxyCODONE (ROXICODONE) 15 MG immediate release tablet Take 15 mg by mouth every 8 (eight) hours as needed for pain. Max of 4 tablets daily    Historical Provider, MD  potassium chloride (K-DUR) 10 MEQ tablet Take 1 tablet (10 mEq total) by mouth daily. Patient not taking: Reported on 11/27/2015 08/22/13   Cristal Ford, DO    Physical Exam:    Constitutional:  A female who is somnolent briefly open eyes to verbal commands and  subsequently falling back to sleep. Vitals:   12/12/15 1933 12/12/15 1941 12/12/15 2027 12/12/15 2031  BP: 115/67 115/67  (!) 142/103  Pulse: 84 84 92 92  Resp: 14 15  23   Temp:      TempSrc:      SpO2: 92% 92%  100%   Eyes: PERRL, lids and conjunctivae normal ENMT: Mucous membranes are dry. Posterior pharynx clear of any exudate or lesions. Normal dentition.  Neck: normal, supple, no masses, no thyromegaly Respiratory: clear to auscultation bilaterally, no wheezing, no crackles. Normal respiratory effort. No accessory muscle use.  Cardiovascular: Regular rate and rhythm, no murmurs / rubs / gallops. No extremity edema. 2+ pedal pulses. No carotid bruits.  Abdomen: no tenderness, no masses palpated. No hepatosplenomegaly. Bowel sounds positive.  Musculoskeletal: no clubbing / cyanosis. No joint deformity upper and lower extremities. Good ROM, no contractures. Normal muscle tone.  Skin: no rashes, lesions, ulcers. No induration Neurologic: CN 2-12 grossly intact. Sensation intact, DTR normal. Strength 5/5 in all 4.  Psychiatric: Somnolent.    Labs on Admission: I have personally reviewed following labs and imaging studies  CBC:  Recent Labs Lab 12/12/15 1833  WBC 4.3  HGB 11.3*  HCT 32.6*  MCV 86.2  PLT [id number]   Basic Metabolic Panel:  Recent Labs Lab 12/12/15 1833  NA 136  K 2.4*  CL 98*  CO2 29  GLUCOSE 106*  BUN 12  CREATININE 0.99  CALCIUM 9.1  MG 1.6*   GFR: CrCl cannot be calculated (Unknown ideal weight.). Liver Function Tests:  Recent Labs Lab 12/12/15 1833  AST 103*  ALT 38  ALKPHOS 84  BILITOT 0.8  PROT 6.5  ALBUMIN 3.9   No results for input(s): LIPASE, AMYLASE in the last 168 hours. No results for input(s): AMMONIA in the last 168 hours. Coagulation Profile: No results for input(s): INR, PROTIME in the last 168 hours. Cardiac Enzymes: No results for input(s): CKTOTAL,  CKMB, CKMBINDEX, TROPONINI in the last 168 hours. BNP (last 3 results) No results for input(s): PROBNP in the last 8760 hours. HbA1C: No results for input(s): HGBA1C in the last 72 hours. CBG:  Recent Labs Lab 12/12/15 1830  GLUCAP 105*   Lipid Profile: No results for input(s): CHOL, HDL, LDLCALC, TRIG, CHOLHDL, LDLDIRECT in the last 72 hours. Thyroid Function Tests: No results for input(s): TSH, T4TOTAL, FREET4, T3FREE, THYROIDAB in the last 72 hours. Anemia Panel: No results for input(s): VITAMINB12, FOLATE, FERRITIN, TIBC, IRON, RETICCTPCT in the last 72 hours. Urine analysis:    Component Value Date/Time   COLORURINE YELLOW 11/27/2015 1743   APPEARANCEUR CLEAR 11/27/2015 1743   LABSPEC 1.009 11/27/2015 1743   PHURINE 7.0 11/27/2015 1743  GLUCOSEU NEGATIVE 11/27/2015 1743   HGBUR NEGATIVE 11/27/2015 1743   BILIRUBINUR NEGATIVE 11/27/2015 [place] 11/27/2015 1743   PROTEINUR NEGATIVE 11/27/2015 1743   UROBILINOGEN 1.0 08/19/2013 1650   NITRITE NEGATIVE 11/27/2015 1743   LEUKOCYTESUR NEGATIVE 11/27/2015 1743   Sepsis Labs: No results found for this or any previous visit (from the past 240 hour(s)).   Radiological Exams on Admission: Ct Head Wo Contrast  Result Date: 12/12/2015 CLINICAL DATA:  Status post fall with lateral neck and upper back pain. EXAM: CT HEAD WITHOUT CONTRAST CT CERVICAL SPINE WITHOUT CONTRAST TECHNIQUE: Multidetector CT imaging of the head and cervical spine was performed following the standard protocol without intravenous contrast. Multiplanar CT image reconstructions of the cervical spine were also generated. COMPARISON:  Head and cervical spine CT scans 11/27/2015. FINDINGS: CT HEAD FINDINGS Brain: Appears normal without hemorrhage, infarct, mass lesion, mass effect, midline shift or abnormal extra-axial fluid collection. No hydrocephalus or pneumocephalus. Vascular: Atherosclerosis noted. Skull: Intact. Sinuses/Orbits: Negative.  Other: None. CT CERVICAL SPINE FINDINGS Alignment: Normal. Skull base and vertebrae: No acute fracture. No primary bone lesion or focal pathologic process. Soft tissues and spinal canal: No prevertebral fluid or swelling. No visible canal hematoma. Disc levels: Intervertebral disc space height is maintained. There is scattered mild to moderate facet degenerative change most notable on the right at C4-5. Upper chest: Emphysematous change is identified. Other: Atherosclerosis noted. IMPRESSION: No acute abnormality head or cervical spine. Atherosclerosis. Emphysema. Electronically Signed   By: Inge Rise M.D.   On: 12/12/2015 19:47   Ct Cervical Spine Wo Contrast  Result Date: 12/12/2015 CLINICAL DATA:  Status post fall with lateral neck and upper back pain. EXAM: CT HEAD WITHOUT CONTRAST CT CERVICAL SPINE WITHOUT CONTRAST TECHNIQUE: Multidetector CT imaging of the head and cervical spine was performed following the standard protocol without intravenous contrast. Multiplanar CT image reconstructions of the cervical spine were also generated. COMPARISON:  Head and cervical spine CT scans 11/27/2015. FINDINGS: CT HEAD FINDINGS Brain: Appears normal without hemorrhage, infarct, mass lesion, mass effect, midline shift or abnormal extra-axial fluid collection. No hydrocephalus or pneumocephalus. Vascular: Atherosclerosis noted. Skull: Intact. Sinuses/Orbits: Negative. Other: None. CT CERVICAL SPINE FINDINGS Alignment: Normal. Skull base and vertebrae: No acute fracture. No primary bone lesion or focal pathologic process. Soft tissues and spinal canal: No prevertebral fluid or swelling. No visible canal hematoma. Disc levels: Intervertebral disc space height is maintained. There is scattered mild to moderate facet degenerative change most notable on the right at C4-5. Upper chest: Emphysematous change is identified. Other: Atherosclerosis noted. IMPRESSION: No acute abnormality head or cervical spine.  Atherosclerosis. Emphysema. Electronically Signed   By: Inge Rise M.D.   On: 12/12/2015 19:47    EKG: Independently reviewed. Sinus rhythm with a prolonged QTC of 521.  Assessment/Plan Accidental overdose: Patient reportedly took multiple home medications in conjunction with marijuana in effort to relieve pain symptoms and denies suicidal ideation. UDS positive for opiates, benzos, and marijuana. - Admit to a telemetry bed - Continuous pulse oximetry with nasal cannula oxygen to keep O2 sats greater than 92% - May warrant psych evaluation and exam  Acute encephalopathy: Likely a combination of patient's medications and marijuana on admission. - neuro checks - NPO for now  Frequent falls - Physical therapy to evaluate and treat  Prolonged QTc: QTC prolonged at 521 - Replacing electrolytes - Recheck EKG in a.m.  Hypokalemia: Patient's initial potassium was 2.4 on admission. Potassium will likely be replaced to  2.7 with the 30 mEq given in the ED. - Potassium chloride 30 meq IV overnight - Recheck BMP in a.m. and replace as needed  Hypomagnesemia: Acute. Initial magnesium level I.6. Patient was given 2 g of magnesium sulfate MDD.  - Recheck magnesium level in a.m. and replace as needed    Chronic pain - Oxycodone not reordered on admission.  Anxiety/depression - Will need to reconcile home medication list once patient more alert  Seizure disorder - continue  keppra IV for now  Marijuana abuse - Will need to counsel him the need for cessation of marijuana abuse  DVT prophylaxis: Lovenox Code Status: Full Family Communication: No family present at bedside   Disposition Plan: TBD Consults called: None Admission status: observation  Norval Morton MD [facility] Pager [phone]  If 7PM-7AM, please contact night-coverage [email or web address] Password [email or web address]  12/12/2015, 8:51 PM

## 2015-12-12 NOTE — ED Triage Notes (Signed)
Per EMS roommate called related to pt lethargic. Pt reports took (2) 15 mg oxycodone, (1) 30 mg flexeril, (2) 1 mg xanax, (3) joints of marijuana for pain management. Upon arrival with EMS pt reports fall with associated lateral neck and upper back pain. C collar in place. Pt denies pain at present time. Pt a/o x4 but lethargic and falls asleep with conversation. Pt denies SI/HI or A/VH

## 2015-12-13 DIAGNOSIS — T424X1A Poisoning by benzodiazepines, accidental (unintentional), initial encounter: Secondary | ICD-10-CM | POA: Diagnosis not present

## 2015-12-13 DIAGNOSIS — Z801 Family history of malignant neoplasm of trachea, bronchus and lung: Secondary | ICD-10-CM | POA: Diagnosis not present

## 2015-12-13 DIAGNOSIS — T50901D Poisoning by unspecified drugs, medicaments and biological substances, accidental (unintentional), subsequent encounter: Secondary | ICD-10-CM

## 2015-12-13 DIAGNOSIS — T407X1A Poisoning by cannabis (derivatives), accidental (unintentional), initial encounter: Secondary | ICD-10-CM | POA: Diagnosis not present

## 2015-12-13 DIAGNOSIS — R9431 Abnormal electrocardiogram [ECG] [EKG]: Secondary | ICD-10-CM | POA: Diagnosis not present

## 2015-12-13 DIAGNOSIS — G9340 Encephalopathy, unspecified: Secondary | ICD-10-CM | POA: Diagnosis not present

## 2015-12-13 DIAGNOSIS — F1910 Other psychoactive substance abuse, uncomplicated: Secondary | ICD-10-CM

## 2015-12-13 DIAGNOSIS — Z823 Family history of stroke: Secondary | ICD-10-CM

## 2015-12-13 DIAGNOSIS — T481X1A Poisoning by skeletal muscle relaxants [neuromuscular blocking agents], accidental (unintentional), initial encounter: Secondary | ICD-10-CM | POA: Diagnosis not present

## 2015-12-13 DIAGNOSIS — G8929 Other chronic pain: Secondary | ICD-10-CM | POA: Diagnosis present

## 2015-12-13 DIAGNOSIS — F11229 Opioid dependence with intoxication, unspecified: Secondary | ICD-10-CM

## 2015-12-13 DIAGNOSIS — T402X1A Poisoning by other opioids, accidental (unintentional), initial encounter: Secondary | ICD-10-CM | POA: Diagnosis not present

## 2015-12-13 DIAGNOSIS — Z9889 Other specified postprocedural states: Secondary | ICD-10-CM | POA: Diagnosis not present

## 2015-12-13 LAB — BASIC METABOLIC PANEL
ANION GAP: 9 (ref 5–15)
BUN: 10 mg/dL (ref 6–20)
CALCIUM: 9 mg/dL (ref 8.9–10.3)
CO2: 27 mmol/L (ref 22–32)
Chloride: 103 mmol/L (ref 101–111)
Creatinine, Ser: 0.98 mg/dL (ref 0.44–1.00)
GFR calc Af Amer: >60 mL/min (ref >60)
GFR calc non Af Amer: 59 mL/min — ABNORMAL LOW (ref >60)
GLUCOSE: 102 mg/dL — AB (ref 65–99)
Potassium: 3.1 mmol/L — ABNORMAL LOW (ref 3.5–5.1)
Sodium: 139 mmol/L (ref 135–145)

## 2015-12-13 LAB — CBC
HEMATOCRIT: 33.6 % — AB (ref 36.0–46.0)
HEMOGLOBIN: 11.6 g/dL — AB (ref 12.0–15.0)
MCH: 30 pg (ref 26.0–34.0)
MCHC: 34.5 g/dL (ref 30.0–36.0)
MCV: 86.8 fL (ref 78.0–100.0)
Platelets: 330 10*3/uL (ref 150–400)
RBC: 3.87 MIL/uL (ref 3.87–5.11)
RDW: 13.5 % (ref 11.5–15.5)
WBC: 4 10*3/uL (ref 4.0–10.5)

## 2015-12-13 LAB — MAGNESIUM: Magnesium: 2.1 mg/dL (ref 1.7–2.4)

## 2015-12-13 MED ORDER — LEVETIRACETAM 250 MG PO TABS
250.0000 mg | ORAL_TABLET | Freq: Two times a day (BID) | ORAL | Status: DC
  Administered 2015-12-13: 250 mg via ORAL
  Filled 2015-12-13 (×3): qty 1

## 2015-12-13 MED ORDER — POTASSIUM CHLORIDE CRYS ER 20 MEQ PO TBCR: 40.0000 meq | EXTENDED_RELEASE_TABLET | ORAL | Status: DC

## 2015-12-13 MED ORDER — HYDROCHLOROTHIAZIDE 25 MG PO TABS: 25.0000 mg | ORAL_TABLET | Freq: Every day | ORAL | 6 refills | Status: AC

## 2015-12-13 MED ORDER — OXYCODONE HCL 5 MG PO TABS
5.0000 mg | ORAL_TABLET | Freq: Four times a day (QID) | ORAL | Status: DC | PRN
  Administered 2015-12-13 (×2): 5 mg via ORAL
  Filled 2015-12-13 (×2): qty 1

## 2015-12-13 MED ORDER — CITALOPRAM HYDROBROMIDE 20 MG PO TABS: 20.0000 mg | ORAL_TABLET | Freq: Every day | ORAL | 6 refills | Status: AC

## 2015-12-13 MED ORDER — HYDROCHLOROTHIAZIDE 25 MG PO TABS
25.0000 mg | ORAL_TABLET | Freq: Every day | ORAL | Status: DC
  Administered 2015-12-13: 25 mg via ORAL
  Filled 2015-12-13: qty 1

## 2015-12-13 MED ORDER — LEVETIRACETAM 250 MG PO TABS: 250.0000 mg | ORAL_TABLET | Freq: Two times a day (BID) | ORAL | 0 refills | Status: AC

## 2015-12-13 NOTE — Care Management Obs Status (Signed)
[facility] NOTIFICATION   Patient Details  Name: [name] [name] MRN: [id number] Date of Birth: 1950/09/20   Medicare Observation Status Notification Given:  Yes    MahabirJuliann Pulse, RN 12/13/2015, 2:17 PM

## 2015-12-13 NOTE — Progress Notes (Signed)
Report received from day shift nurse. Pt. Waiting on ride. HPOA on her way to pick up pt. Pain medicine given to pt for headache. Will continue to monitor and wait for pt. Ride.

## 2015-12-13 NOTE — Consult Note (Signed)
[facility] Consult   Reason for Consult:  Accidental overdose Referring Physician:  Dr. Maryland Pink Patient Identification: [name] [name] MRN:  [id number] Principal Diagnosis: Accidental overdose Diagnosis:   Patient Active Problem List   Diagnosis Date Noted  . Acute encephalopathy [G93.40] 12/13/2015  . Chronic pain [G89.29] 12/13/2015  . Prolonged Q-T interval on ECG [R94.31] 12/12/2015  . Accidental overdose [T50.901A] 12/12/2015  . Fall [W19.XXXA] 11/27/2015  . Generalized weakness [R53.1] 11/27/2015  . Hyponatremia [E87.1] 11/27/2015  . Frequent falls [R29.6]   . Altered mental status [R41.82] 08/19/2013  . Seizures ([place]) [R56.9] 08/19/2013  . Aphasia complicating stroke ([place]) [I63.9, R47.01] 08/19/2013  . Essential hypertension, benign [I10] 08/19/2013  . Hypokalemia [E87.6] 08/19/2013  . Marijuana use [F12.90] 08/19/2013  . Fibromyalgia [M79.7] 08/19/2013    Total Time spent with patient: 1 hour  Subjective:   [name] [name] is a 65 y.o. female patient admitted with overdose.  HPI:  [name] [name] is a 65 y.o. female, Seen, chart reviewed and case discussed with psych at [facility]. Patient reported she has been suffering with substance abuse especially with the opiates and benzodiazepines. Patient reportedly taken her medication accidentally and also smoking marijuana. Patient friend called the emergency medical services. Patient workup in the hospital without behavioral or emotional problems. Patient reported she has been living by herself and also has a Scientist, product/process development but cannot pay for co-pays so she has been in and out of the outpatient medication management. Patient denied current symptoms of depression, anxiety, bipolar mania, psychosis. Patient denied active suicidal/homicidal ideation, intention or plans. Patient contract for safety and willing to follow up with outpatient medical management at this time. Psychiatric [facility] is able to provide psychiatric  outpatient resources in the [place] area and willing to work with patient regarding making an appointment before she will be discharged from hospital.  Medical history: Patient  with medical history significant of HTN, anxiety, depression, fibromyalgia, CVA, seizure, marijuana abuse, and tobacco abuse, OSA, ; who apparently took two 15 mg oxycodone, one 30 mg Flexeril, two 1 mg Xanax, and smoked 3 joints of marijuana for symptom management  after a recent fall. History is mostly obtained via report as patient still very somnolent. Patient complained of significant neck and upper back pain. On review of records patient had been admitted to the hospital previously on 11/19 through 11/24 generalized weakness and falls that was thought to be related to deconditioning and the patient had been given a walker. Unsure at this time if the patient is utilizing a walker. Denies any suicidal ideation.  Past Psychiatric History: anxiety, depression, fibromyalgia and has outpatient medication management at [facility] but noncompliant with medication management.  Risk to Self: Is patient at risk for suicide?: No Risk to Others:   Prior Inpatient Therapy:   Prior Outpatient Therapy:    Past Medical History:  Past Medical History:  Diagnosis Date  . Allergy   . Anxiety   . Arthritis    lower back, hands  . Depression   . Fibromyalgia   . Fibromyalgia   . GERD (gastroesophageal reflux disease)   . Hx of bronchitis   . Hypertension   . Osteoporosis   . Seizures ([place])    last one 05/2015 - on Keppra   . Sleep apnea    Does not use CPAP  . Stroke [facility]) 2015   some aphasia  . Substance abuse     Past Surgical History:  Procedure Laterality Date  . BREAST LUMPECTOMY Right  benign  . CHOLECYSTECTOMY    . TONSILLECTOMY    . WISDOM TOOTH EXTRACTION     Family History:  Family History  Problem Relation Age of Onset  . Alzheimer's disease Mother   . Stroke Father   . Esophageal cancer Maternal  Aunt   . Colon cancer Neg Hx   . Rectal cancer Neg Hx   . Stomach cancer Neg Hx    Family Psychiatric  History: Not contributory Social History:  History  Alcohol Use No     History  Drug Use  . Types: Marijuana    Comment: Last use 11/02/15    Social History   Social History  . Marital status: Single    Spouse name: N/A  . Number of children: N/A  . Years of education: N/A   Social History Main Topics  . Smoking status: Current Every Day Smoker    Packs/day: 0.50    Years: 25.00    Types: Cigarettes  . Smokeless tobacco: Never Used  . Alcohol use No  . Drug use:     Types: Marijuana     Comment: Last use 11/02/15  . Sexual activity: Not Asked   Other Topics Concern  . None   Social History Narrative  . None   Additional Social History:    Allergies:   Allergies  Allergen Reactions  . Tetracyclines & Related Other (See Comments)    Causes pains    Labs:  Results for orders placed or performed during the hospital encounter of 12/12/15 (from the past 48 hour(s))  CBG monitoring, ED     Status: Abnormal   Collection Time: 12/12/15  6:30 PM  Result Value Ref Range   Glucose-Capillary 105 (H) 65 - 99 mg/dL   Comment 1 Notify RN    Comment 2 Document in Chart   Comprehensive metabolic panel     Status: Abnormal   Collection Time: 12/12/15  6:33 PM  Result Value Ref Range   Sodium 136 135 - 145 mmol/L   Potassium 2.4 (LL) 3.5 - 5.1 mmol/L    Comment: CRITICAL RESULT CALLED TO, READ BACK BY AND VERIFIED WITH: J.NASH RN [id number] A.QUIZON    Chloride 98 (L) 101 - 111 mmol/L   CO2 29 22 - 32 mmol/L   Glucose, Bld 106 (H) 65 - 99 mg/dL   BUN 12 6 - 20 mg/dL   Creatinine, Ser 0.99 0.44 - 1.00 mg/dL   Calcium 9.1 8.9 - 10.3 mg/dL   Total Protein 6.5 6.5 - 8.1 g/dL   Albumin 3.9 3.5 - 5.0 g/dL   AST 103 (H) 15 - 41 U/L   ALT 38 14 - 54 U/L   Alkaline Phosphatase 84 38 - 126 U/L   Total Bilirubin 0.8 0.3 - 1.2 mg/dL   GFR calc non Af Amer 59 (L) >60  mL/min   GFR calc Af Amer >60 >60 mL/min    Comment: (NOTE) The eGFR has been calculated using the CKD EPI equation. This calculation has not been validated in all clinical situations. eGFR's persistently <60 mL/min signify possible Chronic Kidney Disease.    Anion gap 9 5 - 15  Ethanol     Status: None   Collection Time: 12/12/15  6:33 PM  Result Value Ref Range   Alcohol, Ethyl (B) <5 <5 mg/dL    Comment:        LOWEST DETECTABLE LIMIT FOR SERUM ALCOHOL IS 5 mg/dL FOR MEDICAL PURPOSES ONLY   Salicylate level  Status: None   Collection Time: 12/12/15  6:33 PM  Result Value Ref Range   Salicylate Lvl <9.6 2.8 - 30.0 mg/dL  Acetaminophen level     Status: Abnormal   Collection Time: 12/12/15  6:33 PM  Result Value Ref Range   Acetaminophen (Tylenol), Serum <10 (L) 10 - 30 ug/mL    Comment:        THERAPEUTIC CONCENTRATIONS VARY SIGNIFICANTLY. A RANGE OF 10-30 ug/mL MAY BE AN EFFECTIVE CONCENTRATION FOR MANY PATIENTS. HOWEVER, SOME ARE BEST TREATED AT CONCENTRATIONS OUTSIDE THIS RANGE. ACETAMINOPHEN CONCENTRATIONS >150 ug/mL AT 4 HOURS AFTER INGESTION AND >50 ug/mL AT 12 HOURS AFTER INGESTION ARE OFTEN ASSOCIATED WITH TOXIC REACTIONS.   cbc     Status: Abnormal   Collection Time: 12/12/15  6:33 PM  Result Value Ref Range   WBC 4.3 4.0 - 10.5 K/uL   RBC 3.78 (L) 3.87 - 5.11 MIL/uL   Hemoglobin 11.3 (L) 12.0 - 15.0 g/dL   HCT 32.6 (L) 36.0 - 46.0 %   MCV 86.2 78.0 - 100.0 fL   MCH 29.9 26.0 - 34.0 pg   MCHC 34.7 30.0 - 36.0 g/dL   RDW 13.3 11.5 - 15.5 %   Platelets 286 150 - 400 K/uL  Magnesium     Status: Abnormal   Collection Time: 12/12/15  6:33 PM  Result Value Ref Range   Magnesium 1.6 (L) 1.7 - 2.4 mg/dL  Rapid urine drug screen (hospital performed)     Status: Abnormal   Collection Time: 12/12/15  7:50 PM  Result Value Ref Range   Opiates POSITIVE (A) NONE DETECTED   Cocaine NONE DETECTED NONE DETECTED   Benzodiazepines POSITIVE (A) NONE DETECTED    Amphetamines NONE DETECTED NONE DETECTED   Tetrahydrocannabinol POSITIVE (A) NONE DETECTED   Barbiturates NONE DETECTED NONE DETECTED    Comment:        DRUG SCREEN FOR MEDICAL PURPOSES ONLY.  IF CONFIRMATION IS NEEDED FOR ANY PURPOSE, NOTIFY LAB WITHIN 5 DAYS.        LOWEST DETECTABLE LIMITS FOR URINE DRUG SCREEN Drug Class       Cutoff (ng/mL) Amphetamine      1000 Barbiturate      200 Benzodiazepine   789 Tricyclics       381 Opiates          300 Cocaine          300 THC              50   Pregnancy, urine     Status: None   Collection Time: 12/12/15  7:50 PM  Result Value Ref Range   Preg Test, Ur NEGATIVE NEGATIVE    Comment:        THE SENSITIVITY OF THIS METHODOLOGY IS >20 mIU/mL.   Basic metabolic panel     Status: Abnormal   Collection Time: 12/13/15  5:39 AM  Result Value Ref Range   Sodium 139 135 - 145 mmol/L   Potassium 3.1 (L) 3.5 - 5.1 mmol/L    Comment: DELTA CHECK NOTED REPEATED TO VERIFY NO VISIBLE HEMOLYSIS    Chloride 103 101 - 111 mmol/L   CO2 27 22 - 32 mmol/L   Glucose, Bld 102 (H) 65 - 99 mg/dL   BUN 10 6 - 20 mg/dL   Creatinine, Ser 0.98 0.44 - 1.00 mg/dL   Calcium 9.0 8.9 - 10.3 mg/dL   GFR calc non Af Amer 59 (L) >60 mL/min   GFR  calc Af Amer >60 >60 mL/min    Comment: (NOTE) The eGFR has been calculated using the CKD EPI equation. This calculation has not been validated in all clinical situations. eGFR's persistently <60 mL/min signify possible Chronic Kidney Disease.    Anion gap 9 5 - 15  CBC     Status: Abnormal   Collection Time: 12/13/15  5:39 AM  Result Value Ref Range   WBC 4.0 4.0 - 10.5 K/uL   RBC 3.87 3.87 - 5.11 MIL/uL   Hemoglobin 11.6 (L) 12.0 - 15.0 g/dL   HCT 33.6 (L) 36.0 - 46.0 %   MCV 86.8 78.0 - 100.0 fL   MCH 30.0 26.0 - 34.0 pg   MCHC 34.5 30.0 - 36.0 g/dL   RDW 13.5 11.5 - 15.5 %   Platelets 330 150 - 400 K/uL  Magnesium     Status: None   Collection Time: 12/13/15  5:39 AM  Result Value Ref Range    Magnesium 2.1 1.7 - 2.4 mg/dL    Current Facility-Administered Medications  Medication Dose Route Frequency Provider Last Rate Last Dose  . acetaminophen (TYLENOL) tablet 650 mg  650 mg Oral Q6H PRN Norval Morton, MD       Or  . acetaminophen (TYLENOL) suppository 650 mg  650 mg Rectal Q6H PRN Rondell A Tamala Julian, MD      . albuterol (PROVENTIL) (2.5 MG/3ML) 0.083% nebulizer solution 2.5 mg  2.5 mg Nebulization Q2H PRN Rondell A Tamala Julian, MD      . enoxaparin (LOVENOX) injection 40 mg  40 mg Subcutaneous Q24H Rondell A Smith, MD      . hydrochlorothiazide (HYDRODIURIL) tablet 25 mg  25 mg Oral Daily Annita Brod, MD   25 mg at 12/13/15 1029  . levETIRAcetam (KEPPRA) tablet 250 mg  250 mg Oral BID Annita Brod, MD   250 mg at 12/13/15 1029  . potassium chloride SA (K-DUR,KLOR-CON) CR tablet 40 mEq  40 mEq Oral STAT Norval Morton, MD        Musculoskeletal: Strength & Muscle Tone: within normal limits Gait & Station: normal Patient leans: N/A  Psychiatric Specialty Exam: Physical Exam as per history and physical   ROS endorses substance abuse and accidental overdose but denied suicidal/homicidal ideation, intention or plans. Patient has no evidence of psychosis.  No Fever-chills, No Headache, No changes with Vision or hearing, reports vertigo No problems swallowing food or Liquids, No Chest pain, Cough or Shortness of Breath, No Abdominal pain, No Nausea or Vommitting, Bowel movements are regular, No Blood in stool or Urine, No dysuria, No new skin rashes or bruises, No new joints pains-aches,  No new weakness, tingling, numbness in any extremity, No recent weight gain or loss, No polyuria, polydypsia or polyphagia,  A full 10 point Review of Systems was done, except as stated above, all other Review of Systems were negative.  Blood pressure 117/66, pulse 84, temperature 97.5 F (36.4 C), resp. rate 17, height _0  (1.575 m), weight 76.1 kg (167 lb 12.8 oz), SpO2 98  %.Body mass index is 30.69 kg/m.  General Appearance: Guarded  Eye Contact:  Good  Speech:  Clear and Coherent  Volume:  Normal  Mood:  Anxious  Affect:  Appropriate and Congruent  Thought Process:  Coherent and Goal Directed  Orientation:  Full (Time, Place, and Person)  Thought Content:  WDL  Suicidal Thoughts:  No  Homicidal Thoughts:  No  Memory:  Immediate;   Good Recent;  Fair Remote;   Fair  Judgement:  Impaired  Insight:  Good  Psychomotor Activity:  Normal  Concentration:  Concentration: Fair and Attention Span: Fair  Recall:  Good  Fund of Knowledge:  Good  Language:  Good  Akathisia:  Negative  Handed:  Right  AIMS (if indicated):     Assets:  Communication Skills Desire for Improvement Financial Resources/Insurance Housing Leisure Time Resilience Social Support Transportation  ADL's:  Intact  Cognition:  WNL  Sleep:        Treatment Plan Summary: 65 years old female with the polysubstance abuse especially opioids, benzodiazepines and tetrahydrocannabinol presented with the accidental overdose of medication. Patient has been free from withdrawal symptoms, awake, alert and oriented 3. Patient denies current symptoms of depression, anxiety, psychosis.   Patient is psychiatrically cleared for the outpatient medication management and substance abuse counseling. Patient has no safety concerns and contract for safety during my evaluation. No medication changes made during my visit Patient will be referred to the outpatient medication management Appreciate psychiatric consultation and we sign off as of today Please contact [phone] or [phone] if needs further assistance   Disposition:  No evidence of imminent risk to self or others at present.   Supportive therapy provided about ongoing stressors.  Ambrose Finland, MD 12/13/2015 11:55 AM

## 2015-12-13 NOTE — Discharge Summary (Signed)
Discharge Summary  [name] [name] [id number] DOB: January 16, 1950  PCP: Harvie Junior, MD  Admit date: 12/12/2015 Discharge date: 12/13/2015  Time spent: 45 minutes   Recommendations for Outpatient Follow-up:  1. Patient advised to discontinue her narcotics and benzodiazepines 2. Would encourage her PCP upon follow-up that patient has reported issues with drug seeking, doctor shopping and recurrent accidental overdose   Discharge Diagnoses:  Active Hospital Problems   Diagnosis Date Noted  . Accidental overdose 12/12/2015  . Acute encephalopathy 12/13/2015  . Chronic pain 12/13/2015  . Substance abuse 12/13/2015  . Prolonged Q-T interval on ECG 12/12/2015  . Frequent falls   . Seizures ([place]) 08/19/2013  . Marijuana use 08/19/2013  . Hypokalemia 08/19/2013    Resolved Hospital Problems   Diagnosis Date Noted Date Resolved  No resolved problems to display.    Discharge Condition: Improved, being discharged home   Diet recommendation: Low-sodium   Vitals:   12/13/15 0523 12/13/15 0827  BP: 117/66   Pulse: 84   Resp: 17   Temp: 98 F (36.7 C) 97.5 F (36.4 C)    History of present illness:  Patient is a 65 year old female past mental history of chronic pain and seizures who is had several ER visits and hospital admissions, the most recent being several weeks ago for acute confusion and accidental overdose secondary to her medications which include benzos and opioids. Patient was brought in on the night of 12/44 taking oxycodone, Xanax, Flexeril and marijuana. This was similar presentation several weeks prior. Patient was somnolent, but awoke slightly from Narcan, but still confused. Observed overnight. Noted prolonged QT on EKG.  Hospital Course:  Principal Problem:   Accidental overdose: By 12/5, patient's mentation normal. Vital signs stable. Requested psychiatry to see after patient was involuntarily committed by her medical power of attorney. Psychiatrist felt  that patient was not at risk of harming herself. However, there are concerns for substance abuse. See below. Active Problems:   Seizures ([place]): Stable. No evidence of seizures during his hospitalization. Continue Keppra   Hypokalemia: Replaced   Marijuana use   Frequent falls: Suspect secondary to overuse of medications   Prolonged Q-T interval on ECG: Repeat EKG this morning with normal QT   Acute encephalopathy: Secondary to accidental overdose. Resolved.   Chronic pain: Patient needs a better plan, advised getting off of narcotics    Substance abuse: Patient's medical power of attorney and friend tried to have patient involuntarily committed. Psychiatrist evaluated patient on 12/5 and found her not to have intentionally tried to harm herself. When I discussed this with her front, she should with me that the patient has serious addiction issues and when she does not get her medications were doctor, she will switch doctors.   Procedures:  None   Consultations:  Psychiatry   Discharge Exam: BP 117/66 (BP Location: Left Arm)   Pulse 84   Temp 97.5 F (36.4 C)   Resp 17   Ht 5\' 2"  (1.575 m)   Wt 76.1 kg (167 lb 12.8 oz)   SpO2 98%   BMI 30.69 kg/m   General: Alert and oriented 3  Cardiovascular: Regular rate and rhythm, S1-S2  Respiratory: Clear to auscultation bilaterally   Discharge Instructions You were cared for by a hospitalist during your hospital stay. If you have any questions about your discharge medications or the care you received while you were in the hospital after you are discharged, you can call the unit and asked to speak with the  hospitalist on call if the hospitalist that took care of you is not available. Once you are discharged, your primary care physician will handle any further medical issues. Please note that NO REFILLS for any discharge medications will be authorized once you are discharged, as it is imperative that you return to your primary care  physician (or establish a relationship with a primary care physician if you do not have one) for your aftercare needs so that they can reassess your need for medications and monitor your lab values.  Discharge Instructions    Diet - low sodium heart healthy    Complete by:  As directed    Increase activity slowly    Complete by:  As directed        Medication List    STOP taking these medications   ALPRAZolam 1 MG tablet Commonly known as:  XANAX   oxyCODONE 15 MG immediate release tablet Commonly known as:  ROXICODONE     TAKE these medications   citalopram 20 MG tablet Commonly known as:  CELEXA Take 1 tablet (20 mg total) by mouth daily.   cyclobenzaprine 10 MG tablet Commonly known as:  FLEXERIL Take 1 mg by mouth 2 (two) times daily.   hydrochlorothiazide 25 MG tablet Commonly known as:  HYDRODIURIL Take 1 tablet (25 mg total) by mouth daily.   levETIRAcetam 250 MG tablet Commonly known as:  KEPPRA Take 1 tablet (250 mg total) by mouth 2 (two) times daily.   omeprazole 40 MG capsule Commonly known as:  PRILOSEC Take 40 mg by mouth daily.      Allergies  Allergen Reactions  . Tetracyclines & Related Other (See Comments)    Causes pains      The results of significant diagnostics from this hospitalization (including imaging, microbiology, ancillary and laboratory) are listed below for reference.    Significant Diagnostic Studies: Dg Chest 2 View  Result Date: 11/27/2015 CLINICAL DATA:  Cough, fever and headache today. EXAM: CHEST  2 VIEW COMPARISON:  PA and lateral chest 08/19/2013. FINDINGS: Lung volumes are low but the lungs are clear with a calcified right upper lobe granuloma noted. No pneumothorax or pleural effusion. Large colonic stool burden noted. IMPRESSION: No acute disease. Large colonic stool burden. Electronically Signed   By: Inge Rise M.D.   On: 11/27/2015 17:20   Ct Head Wo Contrast  Result Date: 12/12/2015 CLINICAL DATA:  Status  post fall with lateral neck and upper back pain. EXAM: CT HEAD WITHOUT CONTRAST CT CERVICAL SPINE WITHOUT CONTRAST TECHNIQUE: Multidetector CT imaging of the head and cervical spine was performed following the standard protocol without intravenous contrast. Multiplanar CT image reconstructions of the cervical spine were also generated. COMPARISON:  Head and cervical spine CT scans 11/27/2015. FINDINGS: CT HEAD FINDINGS Brain: Appears normal without hemorrhage, infarct, mass lesion, mass effect, midline shift or abnormal extra-axial fluid collection. No hydrocephalus or pneumocephalus. Vascular: Atherosclerosis noted. Skull: Intact. Sinuses/Orbits: Negative. Other: None. CT CERVICAL SPINE FINDINGS Alignment: Normal. Skull base and vertebrae: No acute fracture. No primary bone lesion or focal pathologic process. Soft tissues and spinal canal: No prevertebral fluid or swelling. No visible canal hematoma. Disc levels: Intervertebral disc space height is maintained. There is scattered mild to moderate facet degenerative change most notable on the right at C4-5. Upper chest: Emphysematous change is identified. Other: Atherosclerosis noted. IMPRESSION: No acute abnormality head or cervical spine. Atherosclerosis. Emphysema. Electronically Signed   By: Inge Rise M.D.   On: 12/12/2015 19:47  Ct Head Wo Contrast  Result Date: 11/27/2015 CLINICAL DATA:  Acute mental status change.  Lethargic. EXAM: CT HEAD WITHOUT CONTRAST TECHNIQUE: Contiguous axial images were obtained from the base of the skull through the vertex without intravenous contrast. COMPARISON:  August 19, 2013 FINDINGS: Brain: No evidence of acute infarction, hemorrhage, hydrocephalus, extra-axial collection or mass lesion/mass effect. Vascular: No hyperdense vessel or unexpected calcification. Skull: Normal. Negative for fracture or focal lesion. Sinuses/Orbits: No acute finding. Other: None. IMPRESSION: No acute intracranial abnormality.  Electronically Signed   By: Dorise Bullion III M.D   On: 11/27/2015 18:07   Ct Cervical Spine Wo Contrast  Result Date: 12/12/2015 CLINICAL DATA:  Status post fall with lateral neck and upper back pain. EXAM: CT HEAD WITHOUT CONTRAST CT CERVICAL SPINE WITHOUT CONTRAST TECHNIQUE: Multidetector CT imaging of the head and cervical spine was performed following the standard protocol without intravenous contrast. Multiplanar CT image reconstructions of the cervical spine were also generated. COMPARISON:  Head and cervical spine CT scans 11/27/2015. FINDINGS: CT HEAD FINDINGS Brain: Appears normal without hemorrhage, infarct, mass lesion, mass effect, midline shift or abnormal extra-axial fluid collection. No hydrocephalus or pneumocephalus. Vascular: Atherosclerosis noted. Skull: Intact. Sinuses/Orbits: Negative. Other: None. CT CERVICAL SPINE FINDINGS Alignment: Normal. Skull base and vertebrae: No acute fracture. No primary bone lesion or focal pathologic process. Soft tissues and spinal canal: No prevertebral fluid or swelling. No visible canal hematoma. Disc levels: Intervertebral disc space height is maintained. There is scattered mild to moderate facet degenerative change most notable on the right at C4-5. Upper chest: Emphysematous change is identified. Other: Atherosclerosis noted. IMPRESSION: No acute abnormality head or cervical spine. Atherosclerosis. Emphysema. Electronically Signed   By: Inge Rise M.D.   On: 12/12/2015 19:47   Ct Cervical Spine Wo Contrast  Result Date: 11/27/2015 CLINICAL DATA:  65 year old female with multiple recent falls and increasing weakness for 3-4 days. Altered mental status. Initial encounter. EXAM: CT CERVICAL SPINE WITHOUT CONTRAST TECHNIQUE: Multidetector CT imaging of the cervical spine was performed without intravenous contrast. Multiplanar CT image reconstructions were also generated. COMPARISON:  Head CT without contrast 1746 hours today. Brain MRI  08/20/2013. FINDINGS: Alignment: Mild straightening of lower cervical lordosis. Cervicothoracic junction alignment is within normal limits. Bilateral posterior element alignment is within normal limits. Skull base and vertebrae: Visualized skull base is intact. No atlanto-occipital dissociation. No acute osseous abnormality identified. Soft tissues and spinal canal: Negative visualized brain parenchyma. Calcified atherosclerosis at the skull base. Negative visualized noncontrast neck soft tissues. Disc levels: Negative for age aside from mild to moderate multilevel right side cervical facet hypertrophy, most pronounced at C4-C5 (sagittal image 24). No CT evidence of cervical spinal stenosis. Up to mild right C4 and C6 neural foraminal stenosis, mild to moderate right C5 neural foraminal stenosis. Upper chest: Mild scarring, atelectasis, and left apex paraseptal emphysema. Partially visible calcified aortic arch atherosclerosis. Visualized upper thoracic levels appear intact. IMPRESSION: 1.  No acute osseous abnormality in the cervical spine. 2. No CT evidence of cervical spinal stenosis and overall mild for age cervical spine degeneration. There is up to moderate right side facet arthropathy, and associated right C5 neural foraminal stenosis. Electronically Signed   By: Genevie Ann M.D.   On: 11/27/2015 19:52   Ct Lumbar Spine Wo Contrast  Result Date: 11/27/2015 CLINICAL DATA:  65 year old female with multiple recent falls and increasing weakness for 3-4 days. Altered mental status. Initial encounter. EXAM: CT LUMBAR SPINE WITHOUT CONTRAST TECHNIQUE: Multidetector CT imaging  of the lumbar spine was performed without intravenous contrast administration. Multiplanar CT image reconstructions were also generated. COMPARISON:  Head CT without contrast 1746 hours today. FINDINGS: Segmentation: Normal. Alignment: Normal. Vertebrae: No acute osseous abnormality identified. Visible lower thoracic levels appear intact with  degenerative appearing anterior superior endplate irregularity and sclerosis at T12. Lumbar facet arthropathy, detailed below. Visible sacrum and SI joints intact. Paraspinal and other soft tissues: Calcified aortic atherosclerosis. Partially visible cholecystectomy clips and enlargement of the CBD which may simply be postoperative in nature. Negative noncontrast kidneys. Partially visible urinary bladder appears mildly distended. Diverticulosis of the sigmoid colon is partially visible. Retained stool in the colon. Negative visualized posterior paraspinal soft tissues. Disc levels: T11-T12: Vacuum disc. Circumferential disc bulge and endplate spurring. T12-L1:  Mild vacuum disc.  Minimal disc bulge. L1-L2:  Minimal disc bulge. L2-L3:  Minimal disc bulge.  Mild facet hypertrophy. L3-L4: Mild disc space loss. Mild to moderate circumferential disc bulge. Moderate to severe facet hypertrophy. Mild ligament flavum hypertrophy. Mild to moderate spinal stenosis (series 4, image 64). Borderline to mild L3 foraminal stenosis. L4-L5: Mild circumferential disc bulge. Moderate facet hypertrophy. Mild ligament flavum hypertrophy. No significant spinal stenosis. Borderline to mild L4 foraminal stenosis. L5-S1: Mild circumferential disc bulge. Severe facet hypertrophy. Mild to moderate ligament flavum hypertrophy greater on the left. No spinal stenosis. No convincing L5 foraminal stenosis. IMPRESSION: 1.  No acute osseous abnormality. 2. Lower thoracic disc and endplate degeneration, and moderate to severe lumbar facet degeneration from L3-L4 to L5-S1. Multifactorial mild to moderate spinal stenosis at L3-L4. Up to mild L3 and L4 neural foraminal stenosis. 3.  Calcified aortic atherosclerosis. Electronically Signed   By: Genevie Ann M.D.   On: 11/27/2015 19:47   Mr Brain Wo Contrast  Result Date: 11/28/2015 CLINICAL DATA:  65 year old female with history of hypertension, seizure, stroke, marijuana use, tobacco use and sleep  apnea presenting with generalize weakness. Subsequent encounter. EXAM: MRI HEAD WITHOUT CONTRAST TECHNIQUE: Multiplanar, multiecho pulse sequences of the brain and surrounding structures were obtained without intravenous contrast. COMPARISON:  11/27/2015 head CT.  08/20/2013 brain MR. FINDINGS: Exam is motion degraded. Brain: No acute infarct or intracranial hemorrhage. Mild chronic microvascular changes. Mild global atrophy without hydrocephalus. No intracranial mass lesion noted on this unenhanced exam. Partially empty slightly expanded sella unchanged. No flattening of the posterior globes as seen with pseudotumor. Hyperostosis frontalis interna incidentally noted. Vascular: Major intracranial vascular structures are patent and ectatic. Skull and upper cervical spine: Negative. Sinuses/Orbits: No acute orbital abnormality. Minimal mucosal thickening ethmoid sinus air cell and maxillary sinuses. Other: Negative IMPRESSION: Exam is motion degraded. No acute infarct or intracranial hemorrhage. Mild chronic microvascular changes. Mild global atrophy. Partially empty slightly expanded sella unchanged. Electronically Signed   By: Genia Del M.D.   On: 11/28/2015 07:15   Mr Lumbar Spine W Wo Contrast  Result Date: 11/28/2015 CLINICAL DATA:  65 year old female with generalized weakness particularly in legs. Recent falls. Subsequent encounter. EXAM: MRI LUMBAR SPINE WITHOUT AND WITH CONTRAST TECHNIQUE: Multiplanar and multiecho pulse sequences of the lumbar spine were obtained without and with intravenous contrast. CONTRAST:  56mL MULTIHANCE GADOBENATE DIMEGLUMINE 529 MG/ML IV SOLN COMPARISON:  11/27/2015 lumbar spine CT. FINDINGS: Portions of exam are motion degraded. Segmentation: Last fully open disk space is labeled L5-S1. Present examination incorporates from T10-11 disc space through lower sacrum. Alignment:  Minimal curvature. Vertebrae: T11 superior endplate moderate size Schmorl's node deformity with  minimal enhancement surrounding edema suggesting this is subacute  to remote in origin. Anterior T12 superior endplate bony reactive changes felt to be degenerative in origin. Minimal L1-L2 superior endplate degenerative reactive changes. Right T11-12 facet degenerative changes. L3-4 and L5-S1 marked bilateral facet degenerative changes. Moderate L4-5 facet degenerative changes. Conus medullaris: Extends to the just below the L1-2 disc space level and appears normal. Paraspinal and other soft tissues: Prominent size urinary bladder. It is possible this is related to the fact the patient did not void prior to the current exam. Spinal stenosis in the lower thoracic region without significant cord compression. If remainder of thoracic cord needs be evaluated based on clinical findings than thoracic spine MR would be necessary for further delineation. Dilated common bile duct of indeterminate etiology incompletely assessed on present exam. Disc levels: T10-11: Minimal bulge. Facet degenerative changes. Circumferential mild spinal stenosis. T11-12: Facet degenerative changes greater on right. Mild bulge. Mild narrowing ventral thecal sac with minimal cord contact. Mild to moderate right-sided and moderate left-sided foraminal narrowing. T12-L1:  No spinal stenosis or foraminal narrowing. L1-2:  Minimal facet degenerative changes. L2-3:  Mild facet degenerative changes. L3-4: Prominent facet degenerative changes. Fluid within the degenerated facet joints with mild surrounding enhancement and edema. Posterior inferiorly directed left-sided synovial cyst. Ligamentum flavum hypertrophy. Bulge. Multifactorial moderate spinal stenosis and mild bilateral foraminal narrowing. L4-5: Moderate facet degenerative changes. No significant spinal stenosis or foraminal narrowing. L5-S1: Marked bilateral facet degenerative changes. Ligamentum flavum hypertrophy. Minimal to mild bulge. Minimal narrowing superior aspect lateral recess  bilaterally. Very mild bilateral foraminal narrowing. IMPRESSION: L3-4 multifactorial moderate spinal stenosis and mild bilateral foraminal narrowing. L4-5 moderate facet degenerative changes. No significant spinal stenosis or foraminal narrowing. L5-S1 marked bilateral facet degenerative changes. Minimal to mild bulge. Minimal narrowing superior aspect lateral recess bilaterally. Very mild bilateral foraminal narrowing. T11-12 facet degenerative changes greater on right. Mild bulge. Mild narrowing ventral thecal sac with minimal cord contact. Mild to moderate right-sided and moderate left-sided foraminal narrowing. T10-11 circumferential mild spinal stenosis. T11 superior endplate moderate size Schmorl's node deformity with minimal enhancement surrounding edema suggesting this is subacute to remote in origin. Prominent size urinary bladder. It is possible this is related to the fact the patient did not void prior to the current exam. Spinal stenosis in the lower thoracic region without significant cord compression. If remainder of thoracic cord needs be evaluated based on clinical findings than thoracic spine MR would be necessary for further delineation. Dilated common bile duct of indeterminate etiology incompletely assessed on present exam. Electronically Signed   By: Genia Del M.D.   On: 11/28/2015 07:46    Microbiology: No results found for this or any previous visit (from the past 240 hour(s)).   Labs: Basic Metabolic Panel:  Recent Labs Lab 12/12/15 1833 12/13/15 0539  NA 136 139  K 2.4* 3.1*  CL 98* 103  CO2 29 27  GLUCOSE 106* 102*  BUN 12 10  CREATININE 0.99 0.98  CALCIUM 9.1 9.0  MG 1.6* 2.1   Liver Function Tests:  Recent Labs Lab 12/12/15 1833  AST 103*  ALT 38  ALKPHOS 84  BILITOT 0.8  PROT 6.5  ALBUMIN 3.9   No results for input(s): LIPASE, AMYLASE in the last 168 hours. No results for input(s): AMMONIA in the last 168 hours. CBC:  Recent Labs Lab  12/12/15 1833 12/13/15 0539  WBC 4.3 4.0  HGB 11.3* 11.6*  HCT 32.6* 33.6*  MCV 86.2 86.8  PLT 286 330   Cardiac Enzymes: No results for  input(s): CKTOTAL, CKMB, CKMBINDEX, TROPONINI in the last 168 hours. BNP: BNP (last 3 results) No results for input(s): BNP in the last 8760 hours.  ProBNP (last 3 results) No results for input(s): PROBNP in the last 8760 hours.  CBG:  Recent Labs Lab 12/12/15 1830  GLUCAP 105*       Signed:  Annita Brod, MD [facility] 12/13/2015, 3:08 PM

## 2015-12-13 NOTE — Progress Notes (Signed)
Completed D/C teaching with patient. Answered questions. Patient will be D/C home with family in stable condition.

## 2015-12-13 NOTE — Care Management Note (Signed)
Case Management Note  Patient Details  Name: [name] [name] MRN: [id number] Date of Birth: [birth year]  Subjective/Objective:   65 y/o f admitted w/Accidental OD. From home. Noted POA has brought in IVC papers.Psych cons-await recc.                 Action/Plan:d/c plan home.   Expected Discharge Date:   (unknown)               Expected Discharge Plan:  [place]  In-House Referral:  Clinical Social Work  Discharge planning Services  CM Consult  Post Acute Care Choice:    Choice offered to:     DME Arranged:    DME Agency:     HH Arranged:    [place] Agency:     Status of Service:  In process, will continue to follow  If discussed at Long Length of Stay Meetings, dates discussed:    Additional Comments:  Dessa Phi, RN 12/13/2015, 1:21 PM

## 2015-12-13 NOTE — Evaluation (Signed)
Physical Therapy Evaluation Patient Details Name: [name] [name] MRN: [id number] DOB: Feb 18, 1950 Today's Date: 12/13/2015   History of Present Illness  65 y/o female presenting in ED with accidental OD. Pt  has a past medical history of Allergy; Anxiety; Arthritis; Depression; Fibromyalgia; Fibromyalgia; GERD; bronchitis; Hypertension; Osteoporosis; Seizures; Sleep apnea; Stroke(2015); and Substance abuse.  Clinical Impression  Patient evaluated by Physical Therapy with no further acute PT needs identified. All education has been completed and the patient has no further questions.  See below for any follow-up Physical Therapy or equipment needs. PT is signing off. Thank you for this referral.     Follow Up Recommendations No PT follow up    Equipment Recommendations  None recommended by PT    Recommendations for Other Services       Precautions / Restrictions Precautions Precautions: Fall      Mobility  Bed Mobility Overal bed mobility: Modified Independent Bed Mobility: Supine to Sit;Sit to Supine     Supine to sit: Modified independent (Device/Increase time) Sit to supine: Modified independent (Device/Increase time)   General bed mobility comments: increased time, Pt able to scoot up in the bed as well with no physical assist; able to long sit and reach for covers without assist  Transfers Overall transfer level: Needs assistance Equipment used: Rolling walker (2 wheeled) Transfers: Sit to/from Stand Sit to Stand: Min guard;Supervision         General transfer comment: vc for proper use of RW; closeguarding for safety; discussed safety with 4wheeled walker which pt states she has at home  Ambulation/Gait Ambulation/Gait assistance: Min guard;Supervision Ambulation Distance (Feet): 25 Feet (15' x 2) Assistive device: Rolling walker (2 wheeled)       General Gait Details: no unsteadiness, however incr reliance on UEs on RW; walks with knees flexed at times; incr  time required  Stairs            Wheelchair Mobility    Modified Rankin (Stroke Patients Only)       Balance Overall balance assessment: Needs assistance;History of Falls (pt reported)   Sitting balance-Leahy Scale: Good     Standing balance support: No upper extremity supported;During functional activity;Single extremity supported Standing balance-Leahy Scale: Fair Standing balance comment: stood at sink to wash hands, no lOB                             Pertinent Vitals/Pain Pain Assessment: 0-10 Pain Location: legs Pain Descriptors / Indicators: Aching Pain Intervention(s): Monitored during session    Home Living Family/patient expects to be discharged to:: Private residence Living Arrangements: Non-relatives/Friends   Type of Home: Apartment Home Access: Stairs to enter   Technical brewer of Steps: 3 Home Layout: One level Home Equipment: Environmental consultant - 4 wheels;Bedside commode Additional Comments: local son    Prior Function Level of Independence: Independent               Hand Dominance        Extremity/Trunk Assessment   Upper Extremity Assessment: Overall WFL for tasks assessed             RLE Deficits / Details: grossly WFL, slow to move through full AROM LLE Deficits / Details: same as above     Communication   Communication: No difficulties  Cognition Arousal/Alertness: Awake/alert Behavior During Therapy: WFL for tasks assessed/performed Overall Cognitive Status: Within Functional Limits for tasks assessed  General Comments      Exercises     Assessment/Plan    PT Assessment Patent does not need any further PT services  PT Problem List            PT Treatment Interventions      PT Goals (Current goals can be found in the Care Plan section)  Acute Rehab PT Goals Patient Stated Goal: go home today PT Goal Formulation: All assessment and education complete, DC therapy     Frequency     Barriers to discharge        Co-evaluation               End of Session Equipment Utilized During Treatment: Gait belt Activity Tolerance: Patient tolerated treatment well Patient left: in bed;with call bell/phone within reach;with bed alarm set Nurse Communication: Mobility status    Functional Assessment Tool Used: clinical judgement Functional Limitation: Mobility: Walking and moving around Mobility: Walking and Moving Around Current Status [id number]): At least 1 percent but less than 20 percent impaired, limited or restricted Mobility: Walking and Moving Around Goal Status [phone]): At least 1 percent but less than 20 percent impaired, limited or restricted Mobility: Walking and Moving Around Discharge Status [phone]): At least 1 percent but less than 20 percent impaired, limited or restricted    Time: 1002-1029 PT Time Calculation (min) (ACUTE ONLY): 27 min   Charges:   PT Evaluation $PT Eval Low Complexity: 1 Procedure PT Treatments $Gait Training: 8-22 mins   PT G Codes:   PT G-Codes **NOT FOR INPATIENT CLASS** Functional Assessment Tool Used: clinical judgement Functional Limitation: Mobility: Walking and moving around Mobility: Walking and Moving Around Current Status [id number]): At least 1 percent but less than 20 percent impaired, limited or restricted Mobility: Walking and Moving Around Goal Status [phone]): At least 1 percent but less than 20 percent impaired, limited or restricted Mobility: Walking and Moving Around Discharge Status [phone]): At least 1 percent but less than 20 percent impaired, limited or restricted    [facility] 12/13/2015, 10:43 AM

## 2015-12-13 NOTE — Care Management Note (Signed)
Case Management Note  Patient Details  Name: Cacie Goudy MRN: [id number] Date of Birth: [birth year]  Subjective/Objective: Awaiting psych cons-recc. Patient provided w/HHC agency list as resource-chose [facility] if [place] is recc-[facility], HH sw,[id number] would recc if home w/HHC ordered. [facility] rep aware.                   Action/Plan:d/c home.   Expected Discharge Date:   (unknown)               Expected Discharge Plan:  [place]  In-House Referral:  Clinical Social Work  Discharge planning Services  CM Consult  Post Acute Care Choice:    Choice offered to:     DME Arranged:    DME Agency:     HH Arranged:    [place] Agency:     Status of Service:  In process, will continue to follow  If discussed at Long Length of Stay Meetings, dates discussed:    Additional Comments:  Dessa Phi, RN 12/13/2015, 2:17 PM

## 2015-12-14 NOTE — Clinical Social Work Psych Assess (Addendum)
Clinical Social Work Nature conservation officer  Clinical Social Worker:  Lia Hopping, LCSW Date/Time:  12/14/2015, 8:13 AM Referred By:  Clinical Social Work, Physician Date Referred:  12/14/15 Reason for Referral:  Behavioral Health Issues   Presenting Symptoms/Problems  Presenting Symptoms/Problems(in person's/family's own words): " I have a chemical dependency"  Patient presented to ED after accidential overdose on prescription medications. Patient has a histrory of anxiety and depression. Patient has substance use history of marijuana, opiates and benzodiazapine's.     Abuse/Neglect/Trauma History  Abuse/Neglect/Trauma History:  Denies History Abuse/Neglect/Trauma History Comments (indicate dates):  Patient reports having trauma history, "everybody has stressors in their life, things they continuously think about. Patient did not go into detail about the trauma.    Psychiatric History  Psychiatric History:  Denies History Psychiatric Medication:     Current Mental Health Hospitalizations/Previous Mental Health History:  No current mental hospitalizations or mental history.    Current Provider:  Beverly Sessions. Place and Date:  As needed for medications, patient reports she no longer uses [facility].  Patient reports difficulty affording the co-pays.   Current Medications:    Legend:                    Inactive   Active   Linked          Medications 12/13/15 12/14/15 12/15/15 12/16/15 12/17/15 12/18/15 12/19/15  acetaminophen (TYLENOL) tablet 650 mg Dose: 650 mg Freq: Every 6 hours PRN Route: PO PRN Reason: mild pain PRN Comment: or Fever >/= 101 Start: 12/12/15 2131 End: 12/14/15 0012   Admin Instructions:  may give suppository if unable to take PO Maximum dose of acetaminophen in 4000 mg from all sources in 24 hours.    0012-D/C'd         Or acetaminophen (TYLENOL) suppository 650 mg Dose: 650 mg Freq: Every 6 hours PRN Route: RE PRN  Reason: mild pain PRN Comment: or Fever >/= 101 Start: 12/12/15 2131 End: 12/14/15 0012   Admin Instructions:  If unable to take PO Maximum dose of acetaminophen in 4000 mg from all sources in 24 hours.    0012-D/C'd         albuterol (PROVENTIL) (2.5 MG/3ML) 0.083% nebulizer solution 2.5 mg Dose: 2.5 mg Freq: Every 2 hours PRN Route: NEBULIZATION PRN Reason: wheezing Start: 12/12/15 2131 End: 12/14/15 0012    0012-D/C'd         oxyCODONE (Oxy IR/ROXICODONE) immediate release tablet 5 mg Dose: 5 mg Freq: Every 6 hours PRN Route: PO PRN Reason: severe pain Start: 12/13/15 1233 End: 12/14/15 0012   Admin Instructions:  IMMEDIATE RELEASE - OxyIR   1308   2013    0012-D/C'd               Previous Inpatient Admission/Date/Reason:  None reported.    Emotional Health/Current Symptoms  Suicide/Self Harm: None Reported Suicide Attempt in Past (date/description): Denies Suicide attempt in the past.   Other Harmful Behavior (ex. homicidal ideation) (describe):  Denies Homicidal beahviors   Psychotic/Dissociative Symptoms  Psychotic/Dissociative Symptoms: None Reported Other Psychotic/Dissociative Symptoms:  Patient denies auditory visual hallucination    Attention/Behavioral Symptoms  Attention/Behavioral Symptoms: Within Normal Limits Other Attention/Behavioral Symptoms:  Patient reports she is looking for a bigger apartment and the moving process is causing stress.    Cognitive Impairment  Cognitive Impairment:  Within Normal Limits, Orientation - Place, Orientation - Self, Orientation - Situation Other Cognitive Impairment:  Alert and Oriented.  Mood and Adjustment  Mood and Adjustment:  Mood Congruent   Stress, Anxiety, Trauma, Any Recent Loss/Stressor  Stress, Anxiety, Trauma, Any Recent Loss/Stressor: None Reported Anxiety (frequency):  Patient reports anxiety and having panic attacks when feeling overwhelmed.  Phobia (specify):   Compulsive  Behavior (specify):    Obsessive Behavior (specify):    Other Stress, Anxiety, Trauma, Any Recent Loss/Stressor:  Patient had a recent fall while at home. Patient was given more pain medication to help manage the pain.    Substance Abuse/Use  Substance Abuse/Use: Substance Abuse Treatment Needed, Current Substance Use SBIRT Completed (please refer for detailed history):   Self-reported Substance Use (last use and frequency):  Marijuana use, opiates and benzodiazapine's.   Urinary Drug Screen Completed: Yes Alcohol Level:     Environment/Housing/Living Arrangement  Environmental/Housing/Living Arrangement: With Other Relatives Who is in the Home:  Self  Emergency Contact:  Vaughan Sine [phone]   Financial  Financial:  Lorella Nimrod)   Patient's Strengths and Goals  Patient's Strengths and Goals (patient's own words):  " I have a chemical dependency and I am willing to get help. "   Clinical Social Worker's Interpretive Summary  Clinical Social Workers Interpretive Summary: LCSWA met with patient at bedside, explained reason for consult. Patient agreeable to assessment. 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 The patient reports marijuana use. The patient reports having anxiety and panic attacks. She reports at times she feels overwhelmed. She  has been using [facility] but recently stop going due to expensive co-pays. Patient reports she open to seeing another psychiatrist and getting help with her substance use. LCSWA provided patient with resources for outpatient psychiatry as patient reports she continues to have anxiety and depression. Patient was thankful for resources and reported she wanted to choose the facility and make her own appointments.  LCSWA respected patient autonomy and assisted that she follow up and take discharge papers with her.  Pt. Did not give LCSWA permission to  gather collateral information from friend.   LCSW rescinded patient IVC and placed on medical chart.   Disposition  Disposition: Outpatient Referral Made/Needed

## 2015-12-22 ENCOUNTER — Encounter (HOSPITAL_COMMUNITY): Payer: Self-pay | Admitting: Emergency Medicine

## 2015-12-22 ENCOUNTER — Emergency Department (HOSPITAL_COMMUNITY)
Admission: EM | Admit: 2015-12-22 | Discharge: 2015-12-22 | Disposition: A | Discharge status: Home / Self Care | Payer: Medicare Other | Attending: Emergency Medicine | Admitting: Emergency Medicine

## 2015-12-22 DIAGNOSIS — Z8673 Personal history of transient ischemic attack (TIA), and cerebral infarction without residual deficits: Secondary | ICD-10-CM | POA: Diagnosis not present

## 2015-12-22 DIAGNOSIS — I10 Essential (primary) hypertension: Secondary | ICD-10-CM | POA: Diagnosis not present

## 2015-12-22 DIAGNOSIS — F17210 Nicotine dependence, cigarettes, uncomplicated: Secondary | ICD-10-CM | POA: Diagnosis not present

## 2015-12-22 DIAGNOSIS — G8929 Other chronic pain: Secondary | ICD-10-CM | POA: Diagnosis not present

## 2015-12-22 DIAGNOSIS — M79604 Pain in right leg: Secondary | ICD-10-CM | POA: Diagnosis present

## 2015-12-22 MED ORDER — MELOXICAM 15 MG PO TABS: 15.0000 mg | ORAL_TABLET | Freq: Every day | ORAL | 0 refills | Status: AC

## 2015-12-22 NOTE — ED Provider Notes (Signed)
Emergency Department Provider Note   I have reviewed the triage vital signs and the nursing notes.   HISTORY  Chief Complaint Leg Pain and Withdrawal   HPI [name] [name] is a 65 y.o. female with past medical history significant for chronic pain, fibromyalgia, osteoporosis, anxiety, depression, HTN, stroke with aphasia, substance abuse, seizures, came to the ED with complaint of worsening bilateral leg pain for last few days. She states that she uses OxyContin and  Xanax regularly for her chronic pain, she ran out of OxyContin 4 days ago, and she was wondering maybe she is having a withdrawal. She denies any nausea, vomiting or diarrhea. She denies any shakiness or irritability. She do endorse mild lower abdominal pain but denies any dysuria. She do complain of constipation for which she uses stool softeners. She denies any headache, change in vision, rhinorrhea, nasal congestion, chest pain, or palpitations.  She wants to be admitted for detox and rehabilitation.   Past Medical History:  Diagnosis Date  . Allergy   . Anxiety   . Arthritis    lower back, hands  . Depression   . Fibromyalgia   . Fibromyalgia   . GERD (gastroesophageal reflux disease)   . Hx of bronchitis   . Hypertension   . Osteoporosis   . Seizures ([place])    last one 05/2015 - on Keppra   . Sleep apnea    Does not use CPAP  . Stroke [facility]) 2015   some aphasia  . Substance abuse     Patient Active Problem List   Diagnosis Date Noted  . Acute encephalopathy 12/13/2015  . Chronic pain 12/13/2015  . Substance abuse 12/13/2015  . Prolonged Q-T interval on ECG 12/12/2015  . Accidental overdose 12/12/2015  . Fall 11/27/2015  . Generalized weakness 11/27/2015  . Hyponatremia 11/27/2015  . Frequent falls   . Altered mental status 08/19/2013  . Seizures ([place]) 08/19/2013  . Aphasia complicating stroke ([place]) 08/19/2013  . Essential hypertension, benign 08/19/2013  . Hypokalemia 08/19/2013  .  Marijuana use 08/19/2013  . Fibromyalgia 08/19/2013    Past Surgical History:  Procedure Laterality Date  . BREAST LUMPECTOMY Right    benign  . CHOLECYSTECTOMY    . TONSILLECTOMY    . WISDOM TOOTH EXTRACTION      Current Outpatient Rx  . Order #: [id number] Class: Normal  . Order #: [id number] Class: Historical Med  . Order #: [id number] Class: Normal  . Order #: [id number] Class: Normal  . Order #: [id number] Class: Historical Med    Allergies Tetracyclines & related  Family History  Problem Relation Age of Onset  . Alzheimer's disease Mother   . Stroke Father   . Esophageal cancer Maternal Aunt   . Colon cancer Neg Hx   . Rectal cancer Neg Hx   . Stomach cancer Neg Hx     Social History Social History  Substance Use Topics  . Smoking status: Current Every Day Smoker    Packs/day: 0.50    Years: 25.00    Types: Cigarettes  . Smokeless tobacco: Never Used  . Alcohol use No    Review of Systems Constitutional: No fever/chills Eyes: No visual changes. ENT: No sore throat. Cardiovascular: Denies chest pain. Respiratory: Denies shortness of breath. Gastrointestinal:  abdominal pain.  No nausea, no vomiting.  No diarrhea.   constipation. Genitourinary: Negative for dysuria. Musculoskeletal: Negative for back pain.Bilateral leg pain. Skin: Negative for rash. Neurological: Negative for headaches, focal weakness or numbness. Psychiatric: 10-point ROS otherwise negative.  ____________________________________________   PHYSICAL EXAM:  VITAL SIGNS: ED Triage Vitals  Enc Vitals Group     BP 12/22/15 1052 116/87     Pulse Rate 12/22/15 1052 101     Resp 12/22/15 1052 17     Temp 12/22/15 1052 98.1 F (36.7 C)     Temp Source 12/22/15 1052 Oral     SpO2 12/22/15 1052 100 %     Weight --      Height --      Head Circumference --      Peak Flow --      Pain Score 12/22/15 1054 10     Pain Loc --      Pain Edu? --      Excl. in [place]? --    Constitutional: Alert  and oriented. Well appearing and in no acute distress. Eyes: Conjunctivae are normal. PERRL. EOMI. Head: Atraumatic. Nose: No congestion/rhinnorhea. Mouth/Throat: Mucous membranes are moist.  Oropharynx non-erythematous. Neck: No stridor.  No meningeal signs.  Cardiovascular: Normal rate, regular rhythm. Good peripheral circulation. Grossly normal heart sounds.   Respiratory: Normal respiratory effort.  No retractions. Lungs CTAB. Gastrointestinal: Soft and nontender. No distention.   Musculoskeletal: No lower extremity tenderness nor edema. No gross deformities of extremities. Neurologic:  Normal speech and language. No gross focal neurologic deficits are appreciated.  Skin:  Skin is warm, dry and intact. No rash noted.  ____________________________________________   LABS (all labs ordered are listed, but only abnormal results are displayed)  Labs Reviewed - No data to display ____________________________________________  EKG  None ____________________________________________  RADIOLOGY  No results found.  ____________________________________________   PROCEDURES  Procedure(s) performed:   Procedures   ____________________________________________   INITIAL IMPRESSION / ASSESSMENT AND PLAN / ED COURSE  Pertinent labs & imaging results that were available during my care of the patient were reviewed by me and considered in my medical decision making (see chart for details).  HPI [name] [name] is a 65 y.o. female with past medical history significant for chronic pain, fibromyalgia, osteoporosis, anxiety, depression, HTN, stroke with aphasia, substance abuse, seizures, came to the ED with complaint of worsening bilateral leg pain for last few days. She states that she uses OxyContin and  Xanax regularly for her chronic pain, she ran out of OxyContin 4 days ago, and she was wondering maybe she is having a withdrawal.  She does not exhibit any signs of withdrawal at this  time. Her last OxyContin dose was 3-4 days ago. We provided her with the resources to get some help regarding her substance abuse and for detox.   ____________________________________________  FINAL CLINICAL IMPRESSION(S) / ED DIAGNOSES  Chronic pain.   MEDICATIONS GIVEN DURING THIS VISIT:  Medications - No data to display   NEW OUTPATIENT MEDICATIONS STARTED DURING THIS VISIT:  New Prescriptions   No medications on file      Note:  This document was prepared using Dragon voice recognition software and may include unintentional dictation errors.     Lorella Nimrod, MD 12/22/15 1147    Leo Grosser, MD 12/23/15 [phone]

## 2015-12-22 NOTE — Discharge Instructions (Signed)
Thank you for visiting ED today. Please contact above-mentioned resources for your rehabilitation and detox.

## 2015-12-22 NOTE — ED Notes (Addendum)
Pt states has been out of Oxycontin 15mg  since last Friday-- states her last dr [facility] her [facility]" -- only went 1 time. Last rx for meds was from reg dr.  Abbott Pao requesting to get detox, "I want to get off this medicine" taking it for fibromyalgia, osteoporosis,  Also takes xanax -- prescribed by Dr. Jimmye Norman.

## 2015-12-22 NOTE — ED Triage Notes (Signed)
Pt here for chronic leg pain and takes pain meds; pt sts out of them since yesterday and having withdrawal sx; pt requests help with detox

## 2016-01-10 ENCOUNTER — Telehealth: Payer: Self-pay | Admitting: Gastroenterology

## 2016-01-10 ENCOUNTER — Telehealth: Payer: Self-pay

## 2016-01-10 ENCOUNTER — Other Ambulatory Visit: Payer: Self-pay

## 2016-01-10 MED ORDER — NA SULFATE-K SULFATE-MG SULF 17.5-3.13-1.6 GM/177ML PO SOLN: 1.0000 | Freq: Once | ORAL | 0 refills | Status: AC

## 2016-01-10 NOTE — Telephone Encounter (Signed)
Patient states that suprep is too expensive for her and is requesting something else be called in.

## 2016-01-10 NOTE — Telephone Encounter (Signed)
Suprep sent to [facility]

## 2016-01-10 NOTE — Telephone Encounter (Signed)
Offered $50 rebate card for pt. She states that it is still too expensive. She wishes to continue with the original Miralax prep.   Pt states that she does not have the instructions on how to prep and does not have transportation to get here. She request that I fax instructions to pharmacy and that they would get the Miralax and Dulcolax for her to deliver to her home.  I spoke with Claiborne Billings at [facility] and she agreed to receive the instructions and have them delivered to pts home. Original date for [facility] was 11/9. Dates changed on letter and faxed to [phone].

## 2016-01-10 NOTE — Telephone Encounter (Signed)
Note created in error.

## 2016-01-11 ENCOUNTER — Telehealth: Payer: Self-pay

## 2016-01-11 NOTE — Telephone Encounter (Signed)
Mr Brandt Loosen is still on the scheduled for tomorrow 01/12/16 at 2. She did come by the office and pick up the [place]. I asked her if she has been on clear liquids today and she stated that she has had "nothing".  Written instruction for Suprep given to pt.

## 2016-01-11 NOTE — Telephone Encounter (Signed)
Great, thanks for helping to coordinate that for her.

## 2016-01-12 ENCOUNTER — Encounter: Payer: Self-pay | Admitting: Gastroenterology

## 2016-01-12 ENCOUNTER — Ambulatory Visit (AMBULATORY_SURGERY_CENTER): Payer: Medicare Other | Admitting: Gastroenterology

## 2016-01-12 VITALS — BP 131/85 | HR 89 | Temp 97.8°F | Resp 16 | Ht 62.0 in | Wt 165.0 lb

## 2016-01-12 DIAGNOSIS — I10 Essential (primary) hypertension: Secondary | ICD-10-CM | POA: Diagnosis not present

## 2016-01-12 DIAGNOSIS — Z1211 Encounter for screening for malignant neoplasm of colon: Secondary | ICD-10-CM | POA: Diagnosis present

## 2016-01-12 DIAGNOSIS — Z8673 Personal history of transient ischemic attack (TIA), and cerebral infarction without residual deficits: Secondary | ICD-10-CM | POA: Diagnosis not present

## 2016-01-12 DIAGNOSIS — Z1212 Encounter for screening for malignant neoplasm of rectum: Secondary | ICD-10-CM

## 2016-01-12 MED ORDER — SODIUM CHLORIDE 0.9 % IV SOLN
500.0000 mL | INTRAVENOUS | Status: AC
Start: 2016-01-12 — End: ?

## 2016-01-12 NOTE — Progress Notes (Signed)
Report given to PACU RN, vss 

## 2016-01-12 NOTE — Patient Instructions (Signed)
Discharge instructions given. Handout on diverticulosis. Resume previous medications. Will reschedule repeat colonoscopy. YOU HAD AN ENDOSCOPIC PROCEDURE TODAY AT [facility]:   Refer to the procedure report that was given to you for any specific questions about what was found during the examination.  If the procedure report does not answer your questions, please call your gastroenterologist to clarify.  If you requested that your care partner not be given the details of your procedure findings, then the procedure report has been included in a sealed envelope for you to review at your convenience later.  YOU SHOULD EXPECT: Some feelings of bloating in the abdomen. Passage of more gas than usual.  Walking can help get rid of the air that was put into your GI tract during the procedure and reduce the bloating. If you had a lower endoscopy (such as a colonoscopy or flexible sigmoidoscopy) you may notice spotting of blood in your stool or on the toilet paper. If you underwent a bowel prep for your procedure, you may not have a normal bowel movement for a few days.  Please Note:  You might notice some irritation and congestion in your nose or some drainage.  This is from the oxygen used during your procedure.  There is no need for concern and it should clear up in a day or so.  SYMPTOMS TO REPORT IMMEDIATELY:   Following lower endoscopy (colonoscopy or flexible sigmoidoscopy):  Excessive amounts of blood in the stool  Significant tenderness or worsening of abdominal pains  Swelling of the abdomen that is new, acute  Fever of 100F or higher   For urgent or emergent issues, a gastroenterologist can be reached at any hour by calling [phone].   DIET:  We do recommend a small meal at first, but then you may proceed to your regular diet.  Drink plenty of fluids but you should avoid alcoholic beverages for 24 hours.  ACTIVITY:  You should plan to take it easy for the rest of  today and you should NOT DRIVE or use heavy machinery until tomorrow (because of the sedation medicines used during the test).    FOLLOW UP: Our staff will call the number listed on your records the next business day following your procedure to check on you and address any questions or concerns that you may have regarding the information given to you following your procedure. If we do not reach you, we will leave a message.  However, if you are feeling well and you are not experiencing any problems, there is no need to return our call.  We will assume that you have returned to your regular daily activities without incident.  If any biopsies were taken you will be contacted by phone or by letter within the next 1-3 weeks.  Please call us at [phone] if you have not heard about the biopsies in 3 weeks.    SIGNATURES/CONFIDENTIALITY: You and/or your care partner have signed paperwork which will be entered into your electronic medical record.  These signatures attest to the fact that that the information above on your After Visit Summary has been reviewed and is understood.  Full responsibility of the confidentiality of this discharge information lies with you and/or your care-partner.

## 2016-01-12 NOTE — Op Note (Signed)
[facility] Patient Name: [name] [name] Procedure Date: 01/12/2016 2:04 PM MRN: [id number] Endoscopist: Remo Lipps P. Armbruster MD, MD Age: 66 Referring MD:  Date of Birth: Apr 10, 1950 Gender: Female Account #: [id number] Procedure:                Colonoscopy Indications:              Screening for colorectal malignant neoplasm, This                            is the patient's first colonoscopy Medicines:                Monitored Anesthesia Care Procedure:                Pre-Anesthesia Assessment:                           - Prior to the procedure, a History and Physical                            was performed, and patient medications and                            allergies were reviewed. The patient's tolerance of                            previous anesthesia was also reviewed. The risks                            and benefits of the procedure and the sedation                            options and risks were discussed with the patient.                            All questions were answered, and informed consent                            was obtained. Prior Anticoagulants: The patient has                            taken no previous anticoagulant or antiplatelet                            agents. ASA Grade Assessment: III - A patient with                            severe systemic disease. After reviewing the risks                            and benefits, the patient was deemed in                            satisfactory condition to undergo the procedure.  After obtaining informed consent, the colonoscope                            was passed under direct vision. Throughout the                            procedure, the patient's blood pressure, pulse, and                            oxygen saturations were monitored continuously. The                            Model PCF-H190DL [id number]) scope was introduced                            through the  anus and advanced to the the cecum,                            identified by its appearance. The colonoscopy was                            performed without difficulty. The patient tolerated                            the procedure well. The quality of the bowel                            preparation was poor. The rectum was photographed. Scope In: 2:16:16 PM Scope Out: 2:26:25 PM Scope Withdrawal Time: 0 hours 3 minutes 13 seconds  Total Procedure Duration: 0 hours 10 minutes 9 seconds  Findings:                 The perianal and digital rectal examinations were                            normal.                           A large amount of semi-liquid stool was found in                            the entire colon, interfering with visualization.                            Lavage of the area was performed using a large                            amount of sterile water, resulting in incomplete                            clearance with continued poor visualization. The                            preparation was inadequate for screening purposes.  A few medium-mouthed diverticula were found in the                            left colon.                           Of the visualized mucosa, no polyps seen, however                            the exam was inadequate for screening purposes. Complications:            No immediate complications. Estimated blood loss:                            None. Estimated Blood Loss:     Estimated blood loss: none. Impression:               - Preparation of the colon was poor.                           - Stool in the entire examined colon.                           - Diverticulosis in the left colon. Recommendation:           - Patient has a contact number available for                            emergencies. The signs and symptoms of potential                            delayed complications were discussed with the                             patient. Return to normal activities tomorrow.                            Written discharge instructions were provided to the                            patient.                           - Resume previous diet.                           - Continue present medications.                           - Repeat colonoscopy because the bowel preparation                            was suboptimal within the next few months, use 2                            day prep (we can provide samples if needed) Remo Lipps P. Armbruster MD, MD  01/12/2016 2:30:33 PM This report has been signed electronically.

## 2016-01-13 ENCOUNTER — Telehealth: Payer: Self-pay

## 2016-01-13 NOTE — Telephone Encounter (Signed)
  Follow up Call-  Call back number 01/12/2016  Post procedure Call Back phone  # [phone]/room mate dorthy samuels [phone] talk to her if pt. does not answer.  Permission to leave phone message Yes  Some recent data might be hidden     Patient questions:  Do you have a fever, pain , or abdominal swelling? No. Pain Score  0 *  Have you tolerated food without any problems? Yes.    Have you been able to return to your normal activities? Yes.    Do you have any questions about your discharge instructions: Diet   No. Medications  No. Follow up visit  No.  Do you have questions or concerns about your Care? No.  Actions: * If pain score is 4 or above: No action needed, pain <4.

## 2016-01-18 DIAGNOSIS — M519 Unspecified thoracic, thoracolumbar and lumbosacral intervertebral disc disorder: Secondary | ICD-10-CM | POA: Diagnosis not present

## 2016-01-18 DIAGNOSIS — Z09 Encounter for follow-up examination after completed treatment for conditions other than malignant neoplasm: Secondary | ICD-10-CM | POA: Diagnosis not present

## 2016-01-18 DIAGNOSIS — F419 Anxiety disorder, unspecified: Secondary | ICD-10-CM | POA: Diagnosis not present

## 2016-01-18 DIAGNOSIS — G894 Chronic pain syndrome: Secondary | ICD-10-CM | POA: Diagnosis not present

## 2016-01-18 DIAGNOSIS — M797 Fibromyalgia: Secondary | ICD-10-CM | POA: Diagnosis not present

## 2016-02-24 ENCOUNTER — Encounter: Payer: Self-pay | Admitting: Gastroenterology

## 2016-02-24 ENCOUNTER — Ambulatory Visit (AMBULATORY_SURGERY_CENTER): Payer: Self-pay | Admitting: *Deleted

## 2016-02-24 DIAGNOSIS — Z1211 Encounter for screening for malignant neoplasm of colon: Secondary | ICD-10-CM

## 2016-02-24 MED ORDER — PEG-KCL-NACL-NASULF-NA ASC-C 100 G PO SOLR: ORAL | 0 refills | Status: DC

## 2016-02-24 NOTE — Progress Notes (Signed)
Pt denies allergies to eggs or soy products. Denies difficulty with sedation or anesthesia. Denies any diet or weight loss medications. Denies use of supplemental oxygen.  Emmi instructions given for procedure.  

## 2016-03-09 ENCOUNTER — Encounter: Payer: Self-pay | Admitting: Gastroenterology

## 2016-03-09 ENCOUNTER — Ambulatory Visit (AMBULATORY_SURGERY_CENTER): Payer: Medicare HMO | Admitting: Gastroenterology

## 2016-03-09 VITALS — BP 150/87 | HR 81 | Temp 97.5°F | Resp 41 | Ht 62.0 in | Wt 165.0 lb

## 2016-03-09 DIAGNOSIS — Z1211 Encounter for screening for malignant neoplasm of colon: Secondary | ICD-10-CM

## 2016-03-09 DIAGNOSIS — D123 Benign neoplasm of transverse colon: Secondary | ICD-10-CM

## 2016-03-09 DIAGNOSIS — D120 Benign neoplasm of cecum: Secondary | ICD-10-CM | POA: Diagnosis not present

## 2016-03-09 DIAGNOSIS — Z1212 Encounter for screening for malignant neoplasm of rectum: Secondary | ICD-10-CM | POA: Diagnosis not present

## 2016-03-09 DIAGNOSIS — K635 Polyp of colon: Secondary | ICD-10-CM

## 2016-03-09 MED ORDER — SODIUM CHLORIDE 0.9 % IV SOLN: 500.0000 mL | INTRAVENOUS | Status: AC

## 2016-03-09 NOTE — Progress Notes (Signed)
To recover VSS report to RN

## 2016-03-09 NOTE — Progress Notes (Signed)
During admission process pt. Informed me that she had sip of tea  And 1/2 cup of water @ 1200,made CRNA aware prior to procedure.

## 2016-03-09 NOTE — Progress Notes (Signed)
Called to room to assist during endoscopic procedure.  Patient ID and intended procedure confirmed with present staff. Received instructions for my participation in the procedure from the performing physician.  

## 2016-03-09 NOTE — Op Note (Signed)
[facility] Patient Name: [name] [name] Procedure Date: 03/09/2016 1:31 PM MRN: [id number] Endoscopist: Remo Lipps P. Nyella Eckels MD, MD Age: 66 Referring MD:  Date of Birth: [birth year] Gender: Female Account #: [id number] Procedure:                Colonoscopy Indications:              Screening for colorectal malignant neoplasm, last                            exam limited by poor prep Medicines:                Monitored Anesthesia Care Procedure:                Pre-Anesthesia Assessment:                           - Prior to the procedure, a History and Physical                            was performed, and patient medications and                            allergies were reviewed. The patient's tolerance of                            previous anesthesia was also reviewed. The risks                            and benefits of the procedure and the sedation                            options and risks were discussed with the patient.                            All questions were answered, and informed consent                            was obtained. Prior Anticoagulants: The patient has                            taken no previous anticoagulant or antiplatelet                            agents. ASA Grade Assessment: II - A patient with                            mild systemic disease. After reviewing the risks                            and benefits, the patient was deemed in                            satisfactory condition to undergo the procedure.  After obtaining informed consent, the colonoscope                            was passed under direct vision. Throughout the                            procedure, the patient's blood pressure, pulse, and                            oxygen saturations were monitored continuously. The                            Colonoscope was introduced through the anus and                            advanced to the the cecum,  identified by                            appendiceal orifice and ileocecal valve. The                            patient tolerated the procedure well. The quality                            of the bowel preparation was adequate. The                            ileocecal valve, appendiceal orifice, and rectum                            were photographed. The colonoscopy was technically                            difficult and complex due to significant looping                            and a tortuous colon. Scope In: 1:36:13 PM Scope Out: 2:25:11 PM Scope Withdrawal Time: 0 hours 30 minutes 26 seconds  Total Procedure Duration: 0 hours 48 minutes 58 seconds  Findings:                 The perianal and digital rectal examinations were                            normal.                           A 6 mm polyp was found in the cecum. The polyp was                            sessile. The polyp was removed with a cold snare.                            Resection and retrieval were complete.  A 12-14 mm polyp was found in the cecum. The polyp                            was sessile. The polyp was removed with a cold                            snare. Resection and retrieval were complete.                           A 4 mm polyp was found in the hepatic flexure. The                            polyp was flat. The polyp was removed with a cold                            snare. Resection and retrieval were complete.                           Multiple small and large-mouthed diverticula were                            found in the entire colon.                           Internal hemorrhoids were found during retroflexion.                           The colon was tortous with looping and cecal                            intubation required positional changes to the                            patient and abdominal pressure. The bowel prep was                            only fair on  intubation and several minutes were                            spent lavaging the colon to achieve adequate views.                            The colon was spastic. The exam was otherwise                            without abnormality. Complications:            No immediate complications. Estimated blood loss:                            Minimal. Estimated Blood Loss:     Estimated blood loss was minimal. Impression:               - One 6 mm polyp in the  cecum, removed with a cold                            snare. Resected and retrieved.                           - One 12 mm polyp in the cecum, removed with a cold                            snare. Resected and retrieved.                           - One 4 mm polyp at the hepatic flexure, removed                            with a cold snare. Resected and retrieved.                           - Diverticulosis in the entire examined colon.                           - Internal hemorrhoids.                           - The examination was otherwise normal.                           - Tortous colon with spasm, time spent lavaging                            colon due to prep prolonged the procedure. Recommendation:           - Patient has a contact number available for                            emergencies. The signs and symptoms of potential                            delayed complications were discussed with the                            patient. Return to normal activities tomorrow.                            Written discharge instructions were provided to the                            patient.                           - Resume previous diet.                           - Continue present medications.                           -  No ibuprofen, naproxen, or other non-steroidal                            anti-inflammatory drugs for 2 weeks after polyp                            removal.                           - Await pathology results.                            - Repeat colonoscopy is recommended for                            surveillance. The colonoscopy date will be                            determined after pathology results from today's                            exam become available for review. Remo Lipps P. Edmond Ginsberg MD, MD 03/09/2016 2:31:59 PM This report has been signed electronically.

## 2016-03-09 NOTE — Progress Notes (Signed)
Pt's states no medical or surgical changes since previsit or office visit. 

## 2016-03-09 NOTE — Progress Notes (Signed)
No problems noted in the recovery room. maw 

## 2016-03-09 NOTE — Patient Instructions (Signed)
YOU HAD AN ENDOSCOPIC PROCEDURE TODAY AT [facility]:   Refer to the procedure report that was given to you for any specific questions about what was found during the examination.  If the procedure report does not answer your questions, please call your gastroenterologist to clarify.  If you requested that your care partner not be given the details of your procedure findings, then the procedure report has been included in a sealed envelope for you to review at your convenience later.  YOU SHOULD EXPECT: Some feelings of bloating in the abdomen. Passage of more gas than usual.  Walking can help get rid of the air that was put into your GI tract during the procedure and reduce the bloating. If you had a lower endoscopy (such as a colonoscopy or flexible sigmoidoscopy) you may notice spotting of blood in your stool or on the toilet paper. If you underwent a bowel prep for your procedure, you may not have a normal bowel movement for a few days.  Please Note:  You might notice some irritation and congestion in your nose or some drainage.  This is from the oxygen used during your procedure.  There is no need for concern and it should clear up in a day or so.  SYMPTOMS TO REPORT IMMEDIATELY:   Following lower endoscopy (colonoscopy or flexible sigmoidoscopy):  Excessive amounts of blood in the stool  Significant tenderness or worsening of abdominal pains  Swelling of the abdomen that is new, acute  Fever of 100F or higher   Following upper endoscopy (EGD)  Vomiting of blood or coffee ground material  New chest pain or pain under the shoulder blades  Painful or persistently difficult swallowing  New shortness of breath  Fever of 100F or higher  Black, tarry-looking stools  For urgent or emergent issues, a gastroenterologist can be reached at any hour by calling [phone].   DIET:  We do recommend a small meal at first, but then you may proceed to your regular diet.  Drink  plenty of fluids but you should avoid alcoholic beverages for 24 hours.  ACTIVITY:  You should plan to take it easy for the rest of today and you should NOT DRIVE or use heavy machinery until tomorrow (because of the sedation medicines used during the test).    FOLLOW UP: Our staff will call the number listed on your records the next business day following your procedure to check on you and address any questions or concerns that you may have regarding the information given to you following your procedure. If we do not reach you, we will leave a message.  However, if you are feeling well and you are not experiencing any problems, there is no need to return our call.  We will assume that you have returned to your regular daily activities without incident.  If any biopsies were taken you will be contacted by phone or by letter within the next 1-3 weeks.  Please call us at [phone] if you have not heard about the biopsies in 3 weeks.    SIGNATURES/CONFIDENTIALITY: You and/or your care partner have signed paperwork which will be entered into your electronic medical record.  These signatures attest to the fact that that the information above on your After Visit Summary has been reviewed and is understood.  Full responsibility of the confidentiality of this discharge information lies with you and/or your care-partner.   Handouts were given to your care partner on polyps, diverticulosis,  and hemorrhoids. No aspirin, aspirin products,  ibuprofen, naproxen, advil, motrin, aleve, or other non-steroidal anti-inflammatory drugs for 14 days after polyp removal. You may resume your other current medications today. Await biopsy results. Please call if any questions or concerns.

## 2016-03-12 ENCOUNTER — Telehealth: Payer: Self-pay | Admitting: *Deleted

## 2016-03-12 NOTE — Telephone Encounter (Signed)
No answer left message will attempt to call back later this afternoon. SM 

## 2016-03-13 ENCOUNTER — Telehealth: Payer: Self-pay | Admitting: *Deleted

## 2016-03-13 NOTE — Telephone Encounter (Signed)
  Follow up Call-  Call back number 03/09/2016 01/12/2016  Post procedure Call Back phone  # [phone]/room mate dorthy samuels [phone] talk to her if pt. does not answer.  Permission to leave phone message Yes Yes  Some recent data might be hidden     Patient questions:  Do you have a fever, pain , or abdominal swelling? No. Pain Score  0 *  Have you tolerated food without any problems? Yes.    Have you been able to return to your normal activities? Yes.    Do you have any questions about your discharge instructions: Diet   No. Medications  No. Follow up visit  No.  Do you have questions or concerns about your Care? No.  Actions: * If pain score is 4 or above: No action needed, pain <4.

## 2016-03-17 ENCOUNTER — Encounter: Payer: Self-pay | Admitting: Gastroenterology

## 2016-04-23 ENCOUNTER — Telehealth: Payer: Self-pay

## 2016-04-23 NOTE — Telephone Encounter (Signed)
I left a message asking the patient to confirm her PCP. Duane Lope ([place])

## 2016-04-25 NOTE — Telephone Encounter (Signed)
I spoke with the patient who confirmed that Precious Haws is her PCP.  She states that she has had colonoscopy, mammogram and pelvic exam.  She also has an appointment next week. Duane Lope ([place])

## 2016-05-17 ENCOUNTER — Other Ambulatory Visit: Payer: Self-pay | Admitting: Family Medicine

## 2016-05-17 DIAGNOSIS — E2839 Other primary ovarian failure: Secondary | ICD-10-CM

## 2016-05-17 DIAGNOSIS — M810 Age-related osteoporosis without current pathological fracture: Secondary | ICD-10-CM

## 2016-05-25 ENCOUNTER — Inpatient Hospital Stay
Admission: RE | Admit: 2016-05-25 | Discharge: 2016-05-25 | Disposition: A | Discharge status: Home / Self Care | Payer: Self-pay | Source: Ambulatory Visit | Attending: Family Medicine | Admitting: Family Medicine

## 2016-06-11 ENCOUNTER — Encounter: Payer: Self-pay | Admitting: Physical Therapy

## 2016-06-11 ENCOUNTER — Ambulatory Visit: Payer: Medicare HMO | Attending: Family Medicine | Admitting: Physical Therapy

## 2016-06-11 DIAGNOSIS — R262 Difficulty in walking, not elsewhere classified: Secondary | ICD-10-CM | POA: Insufficient documentation

## 2016-06-11 DIAGNOSIS — M5442 Lumbago with sciatica, left side: Secondary | ICD-10-CM | POA: Diagnosis not present

## 2016-06-11 DIAGNOSIS — M5416 Radiculopathy, lumbar region: Secondary | ICD-10-CM | POA: Insufficient documentation

## 2016-06-11 DIAGNOSIS — M5441 Lumbago with sciatica, right side: Secondary | ICD-10-CM | POA: Insufficient documentation

## 2016-06-11 DIAGNOSIS — M6281 Muscle weakness (generalized): Secondary | ICD-10-CM | POA: Diagnosis present

## 2016-06-11 NOTE — Therapy (Signed)
[place], [place], [address] Phone: [phone]   Fax:  [phone]  Physical Therapy Evaluation  Patient Details  Name: [name] [name] MRN: [id number] Date of Birth: 08-25-1950 Referring Provider: Dr. Precious Haws   Encounter Date: 06/11/2016      PT End of Session - 06/11/16 1046    Visit Number 1   Number of Visits 8   PT Start Time 0915   PT Stop Time 1010   PT Time Calculation (min) 55 min   Activity Tolerance Patient limited by pain   Behavior During Therapy [facility] for tasks assessed/performed      Past Medical History:  Diagnosis Date  . Allergy   . Anxiety   . Arthritis    lower back, hands  . Depression   . Fibromyalgia   . Fibromyalgia   . GERD (gastroesophageal reflux disease)   . Hx of bronchitis   . Hypertension   . Neuromuscular disorder (HCC)    fibromyalgia  . Osteoporosis   . Seizures ([place])    last one 11/24/16 states that they come and go  . Sleep apnea    Does not use CPAP  . Stroke [facility]) 2015   some aphasia  . Substance abuse     Past Surgical History:  Procedure Laterality Date  . BREAST LUMPECTOMY Right    benign  . CHOLECYSTECTOMY    . TONSILLECTOMY    . WISDOM TOOTH EXTRACTION      There were no vitals filed for this visit.       Subjective Assessment - 06/11/16 0928    Subjective Pt has had chronic low back pain for many years (20+), she recently had an increase in pain Nov. after stopping medications (opiates).  At that point she was unable to walk.  She has had severe pain in her back and legs, difficulty walking, standing, ADLs.  She would like to use a cane, needs a handrail in the shower.     Pertinent History opiate use, seizures, stroke, fibromyalgia   Limitations Sitting;Lifting;Standing;Walking;House hold activities;Other (comment)  sleep, recreation   How long can you sit comfortably? 10 min    How long can you stand comfortably? with support 30  min    How long can you walk comfortably? with support, less than 10 min    Diagnostic tests 2017 MRI to lumbar spine   Patient Stated Goals Get back to myself, to have relations.     Currently in Pain? Yes   Pain Score 10-Worst pain ever  "12"   Pain Location Back   Pain Orientation Right;Left;Lower   Pain Descriptors / Indicators Tingling;Aching;Stabbing;Burning;Heaviness;Sore;Pressure   Pain Type Chronic pain   Pain Radiating Towards post thighs , calves to feet    Pain Onset More than a month ago   Pain Frequency Constant   Aggravating Factors  activity, everything    Pain Relieving Factors changing positions, Oxycodone did relieve her pain, heat and ice does not help much    Effect of Pain on Daily Activities limits standing walking, reluctant to leave her house            [facility] PT Assessment - 06/11/16 0942      Assessment   Medical Diagnosis Lumbar    Referring Provider Dr. Precious Haws    Onset Date/Surgical Date --  31 + y   Next MD Visit upcoming    Prior Therapy Yes , 8 yrs ago  Precautions   Precautions None     Restrictions   Weight Bearing Restrictions No     Balance Screen   Has the patient fallen in the past 6 months Yes   How many times? 3   Has the patient had a decrease in activity level because of a fear of falling?  Yes   Is the patient reluctant to leave their home because of a fear of falling?  Yes     [place] residence   Living Arrangements Other (Comment)   Available Help at Discharge Friend(s)   Type of [place] Access Level entry   [place] One level   [place] - 4 wheels   Additional Comments would like help in the home intermittently     Prior Function   Level of Independence Independent with basic ADLs;Independent with household mobility with device;Independent with community mobility with device;Needs assistance with homemaking   Vocation On disability      Cognition   Overall Cognitive Status Within Functional Limits for tasks assessed     Observation/Other Assessments   Focus on Therapeutic Outcomes (FOTO)  84%     Sensation   Light Touch Appears Intact   Additional Comments reports sensory disturbance post bilateral legs      Posture/Postural Control   Posture/Postural Control Postural limitations   Postural Limitations Rounded Shoulders;Forward head;Increased lumbar lordosis;Flexed trunk   Posture Comments elevated shoulders with gait, RW      AROM   Lumbar Flexion in sitting WFL (done due to osteoporosis) and safety , pain    Lumbar Extension WFL "good pain"    Lumbar - Right Side Bend 25%   Lumbar - Left Side Bend 25%   Lumbar - Right Rotation WFL   Lumbar - Left Rotation [facility]      Strength   Right Hip Flexion 4/5   Right Hip ABduction 3-/5   Left Hip Flexion 4/5   Left Hip ABduction 3/5   Right Knee Flexion 5/5   Right Knee Extension 5/5   Left Knee Flexion 5/5   Left Knee Extension 5/5   Right Ankle Dorsiflexion 5/5   Left Ankle Dorsiflexion 5/5     Flexibility   Hamstrings good     Palpation   Palpation comment TTP grossly throughout low back and hips     Straight Leg Raise   Findings Negative     Ambulation/Gait   Ambulation Distance (Feet) 150 Feet   Assistive device 4-wheeled walker   Gait Pattern Antalgic            Objective measurements completed on examination: See above findings.          [facility] Adult PT Treatment/Exercise - 06/11/16 0942      Transfers   Transfers Sit to Stand;Stand to Sit;Sit to Supine   Sit to Stand 6: Modified independent (Device/Increase time)   Stand to Sit 6: Modified independent (Device/Increase time)   Sit to Supine 6: Modified independent (Device/Increase time)   Comments rolling I on mat     Ambulation/Gait   Ambulation Surface Level;Indoor     Lumbar Exercises: Stretches   Active Hamstring Stretch 1 rep   Single Knee to Chest Stretch 2 reps;30  seconds   Lower Trunk Rotation 5 reps;10 seconds     Moist Heat Therapy   Number Minutes Moist Heat 10 Minutes   Moist Heat Location Lumbar Spine  PT Education - 06/11/16 1043    Education provided Yes   Education Details PT/POC, HEP, avoid crunches, heating pad, pain scale    Person(s) Educated Patient   Methods Demonstration;Tactile cues;Verbal cues;Handout;Explanation   Comprehension Verbalized understanding;Need further instruction          PT Short Term Goals - 06/11/16 1328      PT SHORT TERM GOAL #1   Title Pt will be I with HEP for general back care    Time 4   Period Weeks   Status New     PT SHORT TERM GOAL #2   Title Pt will complete Berg or other balance screen and understand results.    Time 4   Period Weeks   Status New     PT SHORT TERM GOAL #3   Title Pt will be able to report 10-15 less pain in back with simple ADLs, standing  for< 10 min   Time 4   Period Weeks   Status New           PT Long Term Goals - 06/11/16 1426      PT LONG TERM GOAL #1   Title Pt will be able to demo and understand proper posture, implications for back care, bone density.    Time 8   Period Weeks   Status New     PT LONG TERM GOAL #2   Title Pt will be able to sit for 30 min with support and only min increase in back pain for meals, recreation.    Time 8   Period Weeks   Status New     PT LONG TERM GOAL #3   Title [name] goal to be set based on screen results    Time 8   Period Weeks     PT LONG TERM GOAL #4   Title FOTO score will improve to less than 70% impaired for overall functional improvement.    Time 8   Period Weeks   Status New                Plan - 06/11/16 1318    Clinical Impression Statement Patient presents for stable, chronic low back pain which has been more of an issue since she stopped her opiate medication.  Despite high levels of subjective pain, she was able to move well for the examination.  She has  some hip and core weakness, balance issues and low activity tolerance.     Clinical Presentation Stable   Clinical Decision Making Low   Rehab Potential Good   PT Frequency 1x / week  pt will benefit from 2 xper week but can only do 1 xx financially    PT Duration 8 weeks   PT Treatment/Interventions ADLs/Self Care Home Management;Patient/family education;Gait training;Cryotherapy;Electrical Stimulation;Moist Heat;Ultrasound;Passive range of motion;Neuromuscular re-education;Balance training;Therapeutic exercise;Therapeutic activities;Functional mobility training;Manual techniques;DME Instruction;Other (comment)  caution with IFC for seizures (lumbar)    PT Next Visit Plan check HEP, Nustep, modalities for pain , IFC precaution (does not have epilepsy)    PT Home Exercise Plan trunk rot, hamstring, knee to chest    Consulted and Agree with Plan of Care Patient      Patient will benefit from skilled therapeutic intervention in order to improve the following deficits and impairments:  Decreased endurance, Abnormal gait, Decreased activity tolerance, Decreased balance, Decreased range of motion, Postural dysfunction, Impaired flexibility, Improper body mechanics, Difficulty walking, Decreased mobility, Decreased strength, Impaired sensation  Visit Diagnosis: Bilateral low  back pain with bilateral sciatica, unspecified chronicity  Difficulty in walking, not elsewhere classified  Muscle weakness (generalized)  Radiculopathy, lumbar region     Problem List Patient Active Problem List   Diagnosis Date Noted  . Acute encephalopathy 12/13/2015  . Chronic pain 12/13/2015  . Substance abuse 12/13/2015  . Prolonged Q-T interval on ECG 12/12/2015  . Accidental overdose 12/12/2015  . Fall 11/27/2015  . Generalized weakness 11/27/2015  . Hyponatremia 11/27/2015  . Frequent falls   . Altered mental status 08/19/2013  . Seizures ([place]) 08/19/2013  . Aphasia complicating stroke 34/19/3790  .  Essential hypertension, benign 08/19/2013  . Hypokalemia 08/19/2013  . Marijuana use 08/19/2013  . Fibromyalgia 08/19/2013    [name] [name] 06/11/2016, 2:38 PM  [facility] [address] [place], [place], [address] Phone: [phone]   Fax:  [phone]  Name: [name] [name] MRN: [id number] Date of Birth: 07-15-1950   Raeford Razor, PT 06/11/16 2:39 PM Phone: [phone] Fax: [phone]

## 2016-06-11 NOTE — Patient Instructions (Signed)
Lower Trunk Rotation Stretch    Keeping back flat and feet together, rotate knees to left side. Hold __10-15__ seconds. Repeat __10 times, do 2 times per day.    http://orth.exer.us/123   Copyright  VHI. All rights reserved.   HIP: Hamstrings - Short Sitting    Rest leg on raised surface. Keep knee straight. Lift chest. Hold _30__ seconds. _3__ reps per set, _2__ sets per day, _7__ days per week  Copyright  VHI. All rights reserved.    Knee to Chest    Lying supine, bend involved knee to chest __3_ times. Repeat with other leg. Hold 30 sec  Do __2_ times per day.  Copyright  VHI. All rights reserved.

## 2016-06-29 ENCOUNTER — Ambulatory Visit: Payer: Medicare HMO | Admitting: Physical Therapy

## 2016-06-29 ENCOUNTER — Telehealth: Payer: Self-pay | Admitting: Physical Therapy

## 2016-06-29 NOTE — Telephone Encounter (Signed)
Called patient regarding her missed appt.  Her bus was late and she was unable to make it in time to be seen.  She plans to come to her next appt. Thursday 6/28 at 1:00 pm.

## 2016-07-05 ENCOUNTER — Ambulatory Visit: Payer: Medicare HMO | Admitting: Physical Therapy

## 2016-07-05 DIAGNOSIS — M6281 Muscle weakness (generalized): Secondary | ICD-10-CM

## 2016-07-05 DIAGNOSIS — M5416 Radiculopathy, lumbar region: Secondary | ICD-10-CM

## 2016-07-05 DIAGNOSIS — M5441 Lumbago with sciatica, right side: Secondary | ICD-10-CM

## 2016-07-05 DIAGNOSIS — M5442 Lumbago with sciatica, left side: Principal | ICD-10-CM

## 2016-07-05 DIAGNOSIS — R262 Difficulty in walking, not elsewhere classified: Secondary | ICD-10-CM

## 2016-07-05 NOTE — Therapy (Signed)
[place], [place], [address] Phone: [phone]   Fax:  [phone]  Physical Therapy Treatment  Patient Details  Name: [name] [name] MRN: [id number] Date of Birth: 09-15-1950 Referring Provider: Dr. Precious Haws   Encounter Date: 07/05/2016      PT End of Session - 07/05/16 1228    Visit Number 2   Number of Visits 8   Date for PT Re-Evaluation 08/06/16   PT Start Time 1212   PT Stop Time 1310   PT Time Calculation (min) 58 min      Past Medical History:  Diagnosis Date  . Allergy   . Anxiety   . Arthritis    lower back, hands  . Depression   . Fibromyalgia   . Fibromyalgia   . GERD (gastroesophageal reflux disease)   . Hx of bronchitis   . Hypertension   . Neuromuscular disorder (HCC)    fibromyalgia  . Osteoporosis   . Seizures ([place])    last one 11/24/16 states that they come and go  . Sleep apnea    Does not use CPAP  . Stroke [facility]) 2015   some aphasia  . Substance abuse     Past Surgical History:  Procedure Laterality Date  . BREAST LUMPECTOMY Right    benign  . CHOLECYSTECTOMY    . TONSILLECTOMY    . WISDOM TOOTH EXTRACTION      There were no vitals filed for this visit.      Subjective Assessment - 07/05/16 1215    Subjective My back hurts alot    Currently in Pain? Yes   Pain Score 10-Worst pain ever   Pain Location Back                         [facility] Adult PT Treatment/Exercise - 07/05/16 0001      Lumbar Exercises: Stretches   Active Hamstring Stretch 2 reps;20 seconds   Single Knee to Chest Stretch 2 reps;30 seconds   Lower Trunk Rotation 5 reps;10 seconds   Lower Trunk Rotation Limitations with overpressure/guidance    Pelvic Tilt 10 seconds   Pelvic Tilt Limitations 10 press with cues for breathing added to HEP      Lumbar Exercises: Aerobic   Stationary Bike Nustep L 2 x 6 minutes UE/LE      Lumbar Exercises: Supine   Other Supine  Lumbar Exercises clam with blue band and abdominal draw in x 10     Modalities   Modalities Electrical Stimulation     Moist Heat Therapy   Number Minutes Moist Heat 15 Minutes   Moist Heat Location Lumbar Spine     Electrical Stimulation   Electrical Stimulation Location Lumbar   Electrical Stimulation Action IFC   Electrical Stimulation Parameters to tolerance   Electrical Stimulation Goals Pain                  PT Short Term Goals - 06/11/16 1328      PT SHORT TERM GOAL #1   Title Pt will be I with HEP for general back care    Time 4   Period Weeks   Status New     PT SHORT TERM GOAL #2   Title Pt will complete Berg or other balance screen and understand results.    Time 4   Period Weeks   Status New     PT SHORT TERM GOAL #3  Title Pt will be able to report 10-15 less pain in back with simple ADLs, standing  for< 10 min   Time 4   Period Weeks   Status New           PT Long Term Goals - 06/11/16 1426      PT LONG TERM GOAL #1   Title Pt will be able to demo and understand proper posture, implications for back care, bone density.    Time 8   Period Weeks   Status New     PT LONG TERM GOAL #2   Title Pt will be able to sit for 30 min with support and only min increase in back pain for meals, recreation.    Time 8   Period Weeks   Status New     PT LONG TERM GOAL #3   Title [name] goal to be set based on screen results    Time 8   Period Weeks     PT LONG TERM GOAL #4   Title FOTO score will improve to less than 70% impaired for overall functional improvement.    Time 8   Period Weeks   Status New               Plan - 07/05/16 1316    Clinical Impression Statement Pt reports 10/10 pain constant. She is trying to learn new things to cope wiht the pain. She tried the exercises at home. Reviewed them and progressed to abominal contraction with pelvic tilts. Pt reports she does something like it at home already. She has tried  Electrical stimulation in the past and reports it was helpful and she had no issue with seizures while using estim. She is interested in a TENS unit for home. Faxed an order to her MD office for signature.    PT Next Visit Plan check HEP, Nustep, modalities for pain , IFC precaution (does not have epilepsy) BERG    PT Home Exercise Plan trunk rot, hamstring, knee to chest    Consulted and Agree with Plan of Care Patient      Patient will benefit from skilled therapeutic intervention in order to improve the following deficits and impairments:  Decreased endurance, Abnormal gait, Decreased activity tolerance, Decreased balance, Decreased range of motion, Postural dysfunction, Impaired flexibility, Improper body mechanics, Difficulty walking, Decreased mobility, Decreased strength, Impaired sensation  Visit Diagnosis: Bilateral low back pain with bilateral sciatica, unspecified chronicity  Difficulty in walking, not elsewhere classified  Muscle weakness (generalized)  Radiculopathy, lumbar region     Problem List Patient Active Problem List   Diagnosis Date Noted  . Acute encephalopathy 12/13/2015  . Chronic pain 12/13/2015  . Substance abuse 12/13/2015  . Prolonged Q-T interval on ECG 12/12/2015  . Accidental overdose 12/12/2015  . Fall 11/27/2015  . Generalized weakness 11/27/2015  . Hyponatremia 11/27/2015  . Frequent falls   . Altered mental status 08/19/2013  . Seizures ([place]) 08/19/2013  . Aphasia complicating stroke 09/81/1914  . Essential hypertension, benign 08/19/2013  . Hypokalemia 08/19/2013  . Marijuana use 08/19/2013  . Fibromyalgia 08/19/2013    Dorene Ar, PTA 07/05/2016, 1:34 PM  [place], [place], [address] Phone: [phone]   Fax:  [phone]  Name: [name] [name] MRN: [id number] Date of Birth: 1950-03-31

## 2016-07-05 NOTE — Patient Instructions (Signed)
Pelvic Tilt (Flexion)    With feet flat and knees bent, flatten lower back into bed. Tighten stomach muscles. Hold __5__ seconds. Repeat __10__ times. Do __2__ sessions per day.

## 2016-07-17 ENCOUNTER — Encounter: Payer: Self-pay | Admitting: Physical Therapy

## 2016-07-17 ENCOUNTER — Ambulatory Visit: Payer: Medicare HMO | Attending: Family Medicine | Admitting: Physical Therapy

## 2016-07-17 DIAGNOSIS — M5442 Lumbago with sciatica, left side: Secondary | ICD-10-CM | POA: Insufficient documentation

## 2016-07-17 DIAGNOSIS — M5441 Lumbago with sciatica, right side: Secondary | ICD-10-CM | POA: Diagnosis present

## 2016-07-17 DIAGNOSIS — M5416 Radiculopathy, lumbar region: Secondary | ICD-10-CM | POA: Diagnosis present

## 2016-07-17 DIAGNOSIS — R262 Difficulty in walking, not elsewhere classified: Secondary | ICD-10-CM | POA: Insufficient documentation

## 2016-07-17 DIAGNOSIS — M6281 Muscle weakness (generalized): Secondary | ICD-10-CM | POA: Insufficient documentation

## 2016-07-17 NOTE — Patient Instructions (Addendum)
TENS stands for Transcutaneous Electrical Nerve Stimulation. In other words, electrical impulses are allowed to pass through the skin in order to excite a nerve.   Purpose and Use of TENS:  TENS is a method used to manage acute and chronic pain without the use of drugs. It has been effective in managing pain associated with surgery, sprains, strains, trauma, rheumatoid arthritis, and neuralgias. It is a non-addictive, low risk, and non-invasive technique used to control pain. It is not, by any means, a curative form of treatment.   How TENS Works:  Most TENS units are a Paramedic unit powered by one 9 volt battery. Attached to the outside of the unit are two lead wires where two pins and/or snaps connect on each wire. All units come with a set of four reusable pads or electrodes. These are placed on the skin surrounding the area involved. By inserting the leads into  the pads, the electricity can pass from the unit making the circuit complete.  As the intensity is turned up slowly, the electrical current enters the body from the electrodes through the skin to the surrounding nerve fibers. This triggers the release of hormones from within the body. These hormones contain pain relievers. By increasing the circulation of these hormones, the person's pain may be lessened. It is also believed that the electrical stimulation itself helps to block the pain messages being sent to the brain, thus also decreasing the body's perception of pain.   Hazards:  TENS units are NOT to be used by patients with PACEMAKERS, DEFIBRILLATORS, DIABETIC PUMPS, PREGNANT WOMEN, and patients with SEIZURE DISORDERS.  TENS units are NOT to be used over the heart, throat, brain, or spinal cord.  One of the major side effects from the TENS unit may be skin irritation. Some people may develop a rash if they are sensitive to the materials used in the electrodes or the connecting wires.   Wear the unit for as needed for pain  with 1 hour breaks.   Avoid overuse due the body getting used to the stem making it not as effective over time.      Hamstring Stretch - Supine    Lie on back, uninvolved leg raised toward ceiling, towel around knee. Pull thigh toward chest until a stretch is felt on back of thigh. Hold for __30_ seconds. If possible, move towel up to calf and repeat. Repeat on involved leg. Repeat __3_ times. Do __2_ times per day.  Copyright  VHI. All rights reserved.

## 2016-07-17 NOTE — Therapy (Signed)
[place], [place], [address] Phone: [phone]   Fax:  [phone]  Physical Therapy Treatment  Patient Details  Name: [name] [name] MRN: [id number] Date of Birth: 1950-11-30 Referring Provider: Dr. Precious Haws   Encounter Date: 07/17/2016      PT End of Session - 07/17/16 1341    Visit Number 3   Number of Visits 8   Date for PT Re-Evaluation 08/06/16   PT Start Time 1325   PT Stop Time 1425   PT Time Calculation (min) 60 min   Activity Tolerance Patient limited by pain   Behavior During Therapy [facility] for tasks assessed/performed      Past Medical History:  Diagnosis Date  . Allergy   . Anxiety   . Arthritis    lower back, hands  . Depression   . Fibromyalgia   . Fibromyalgia   . GERD (gastroesophageal reflux disease)   . Hx of bronchitis   . Hypertension   . Neuromuscular disorder (HCC)    fibromyalgia  . Osteoporosis   . Seizures ([place])    last one 11/24/16 states that they come and go  . Sleep apnea    Does not use CPAP  . Stroke [facility]) 2015   some aphasia  . Substance abuse     Past Surgical History:  Procedure Laterality Date  . BREAST LUMPECTOMY Right    benign  . CHOLECYSTECTOMY    . TONSILLECTOMY    . WISDOM TOOTH EXTRACTION      There were no vitals filed for this visit.      Subjective Assessment - 07/17/16 1330    Subjective My whole body hurts, thigh, legs, back. Have been doing her exercises.     Currently in Pain? Yes   Pain Score 10-Worst pain ever   Pain Location Back   Pain Orientation Right;Left;Lower   Pain Descriptors / Indicators Aching;Stabbing   Pain Type Chronic pain   Pain Onset More than a month ago   Pain Frequency Constant   Aggravating Factors  activity    Pain Relieving Factors not much yet                         [facility] Adult PT Treatment/Exercise - 07/17/16 0001      Self-Care   Self-Care Heat/Ice Application;Other  Self-Care Comments   Other Self-Care Comments  Home TENS unit      Lumbar Exercises: Stretches   Active Hamstring Stretch 3 reps;30 seconds   Single Knee to Chest Stretch 2 reps;30 seconds   Lower Trunk Rotation 5 reps;10 seconds   Lower Trunk Rotation Limitations with overpressure/guidance    Pelvic Tilt 10 seconds   Pelvic Tilt Limitations 10 press with cues for breathing added to HEP      Lumbar Exercises: Aerobic   Stationary Bike Nustep L 5 x 3 minute, had to stop due to leg pain an did another minute      Lumbar Exercises: Supine   Ab Set 10 reps   Clam 10 reps   Bent Knee Raise 10 reps   Bridge 10 reps   Bridge Limitations partial , "good hurt"      Modalities   Modalities Electrical Stimulation     Moist Heat Therapy   Number Minutes Moist Heat 15 Minutes   Moist Heat Location Lumbar Spine                PT  Education - 07/17/16 1341    Education provided Yes   Education Details HEP correction and Home TENS unit application and info   Person(s) Educated Patient   Methods Explanation;Handout;Demonstration;Verbal cues;Tactile cues   Comprehension Verbalized understanding;Returned demonstration;Need further instruction          PT Short Term Goals - 07/17/16 1416      PT SHORT TERM GOAL #1   Title Pt will be I with HEP for general back care    Status On-going     PT SHORT TERM GOAL #2   Title Pt will complete Berg or other balance screen and understand results.    Status Unable to assess     PT SHORT TERM GOAL #3   Title Pt will be able to report 10-15 less pain in back with simple ADLs, standing  for< 10 min   Status On-going           PT Long Term Goals - 07/17/16 1416      PT LONG TERM GOAL #1   Title Pt will be able to demo and understand proper posture, implications for back care, bone density.    Status On-going     PT LONG TERM GOAL #2   Title Pt will be able to sit for 30 min with support and only min increase in back pain for  meals, recreation.    Status On-going     PT LONG TERM GOAL #3   Title Berg goal to be set based on screen results    Status Unable to assess     PT LONG TERM GOAL #4   Title FOTO score will improve to less than 70% impaired for overall functional improvement.    Status On-going               Plan - 07/17/16 1348    Clinical Impression Statement Pt needs cues for HEP, technique and to stay on task.  She tends to posterior tilt vs find a neutral spine with stabilization exercises.  I did not add today's stabilization ex due to the complexity of it.  She seemed comfortbale during mat exercises despite a 1010 pain.  During application of TENS unit pain was 7/10.     PT Next Visit Plan gait, balance watch her walk with the cane/Berg     PT Home Exercise Plan trunk rot, hamstring, knee to chest , PPT    Consulted and Agree with Plan of Care Patient      Patient will benefit from skilled therapeutic intervention in order to improve the following deficits and impairments:  Decreased endurance, Abnormal gait, Decreased activity tolerance, Decreased balance, Decreased range of motion, Postural dysfunction, Impaired flexibility, Improper body mechanics, Difficulty walking, Decreased mobility, Decreased strength, Impaired sensation  Visit Diagnosis: Bilateral low back pain with bilateral sciatica, unspecified chronicity  Difficulty in walking, not elsewhere classified  Muscle weakness (generalized)  Radiculopathy, lumbar region     Problem List Patient Active Problem List   Diagnosis Date Noted  . Acute encephalopathy 12/13/2015  . Chronic pain 12/13/2015  . Substance abuse 12/13/2015  . Prolonged Q-T interval on ECG 12/12/2015  . Accidental overdose 12/12/2015  . Fall 11/27/2015  . Generalized weakness 11/27/2015  . Hyponatremia 11/27/2015  . Frequent falls   . Altered mental status 08/19/2013  . Seizures ([place]) 08/19/2013  . Aphasia complicating stroke 81/82/9937  .  Essential hypertension, benign 08/19/2013  . Hypokalemia 08/19/2013  . Marijuana use 08/19/2013  . Fibromyalgia 08/19/2013  [name] [name] 07/17/2016, 2:20 PM  [place], [place], [address] Phone: [phone]   Fax:  [phone]  Name: [name] [name] MRN: [id number] Date of Birth: 08-11-1950  Raeford Razor, PT 07/17/16 2:20 PM Phone: [phone] Fax: [phone]

## 2016-07-26 ENCOUNTER — Ambulatory Visit: Payer: Medicare HMO | Admitting: Physical Therapy

## 2016-07-26 DIAGNOSIS — M5442 Lumbago with sciatica, left side: Secondary | ICD-10-CM | POA: Diagnosis not present

## 2016-07-26 DIAGNOSIS — M5441 Lumbago with sciatica, right side: Secondary | ICD-10-CM

## 2016-07-26 DIAGNOSIS — M6281 Muscle weakness (generalized): Secondary | ICD-10-CM

## 2016-07-26 DIAGNOSIS — M5416 Radiculopathy, lumbar region: Secondary | ICD-10-CM

## 2016-07-26 DIAGNOSIS — R262 Difficulty in walking, not elsewhere classified: Secondary | ICD-10-CM

## 2016-07-26 NOTE — Therapy (Signed)
[place], [place], [address] Phone: [phone]   Fax:  [phone]  Physical Therapy Treatment  Patient Details  Name: [name] [name] MRN: [id number] Date of Birth: [birth year] Referring Provider: Dr. Precious Haws   Encounter Date: 07/26/2016      PT End of Session - 07/26/16 1024    Visit Number 4   Number of Visits 8   Date for PT Re-Evaluation 08/06/16   PT Start Time 0350   PT Stop Time 1116   PT Time Calculation (min) 61 min      Past Medical History:  Diagnosis Date  . Allergy   . Anxiety   . Arthritis    lower back, hands  . Depression   . Fibromyalgia   . Fibromyalgia   . GERD (gastroesophageal reflux disease)   . Hx of bronchitis   . Hypertension   . Neuromuscular disorder (HCC)    fibromyalgia  . Osteoporosis   . Seizures ([place])    last one 11/24/16 states that they come and go  . Sleep apnea    Does not use CPAP  . Stroke [facility]) 2015   some aphasia  . Substance abuse     Past Surgical History:  Procedure Laterality Date  . BREAST LUMPECTOMY Right    benign  . CHOLECYSTECTOMY    . TONSILLECTOMY    . WISDOM TOOTH EXTRACTION      There were no vitals filed for this visit.      Subjective Assessment - 07/26/16 1023    Subjective I cant get my TENS unit pads on by myself.    Currently in Pain? Yes   Pain Score 8    Pain Location Back  neck and shoulder blades    Pain Orientation Right;Left;Lower   Pain Descriptors / Indicators Aching;Stabbing   Aggravating Factors  activity, walking    Pain Relieving Factors TENS                          [facility] Adult PT Treatment/Exercise - 07/26/16 0001      Ambulation/Gait   Ambulation Distance (Feet) 40 Feet   Assistive device Small based quad cane;Straight cane   Gait Pattern Antalgic   Ambulation Surface Indoor   Gait Comments cues for both step to and step through gait pattern, difficulty sequencing,  decreased safety     Standardized Balance Assessment   Standardized Balance Assessment Berg Balance Test     Berg Balance Test   Sit to Stand Able to stand without using hands and stabilize independently   Standing Unsupported Able to stand safely 2 minutes   Sitting with Back Unsupported but Feet Supported on Floor or Stool Able to sit safely and securely 2 minutes   Stand to Sit Sits safely with minimal use of hands   Transfers Able to transfer safely, definite need of hands   Standing Unsupported with Eyes Closed Able to stand 10 seconds with supervision   Standing Ubsupported with Feet Together Able to place feet together independently and stand for 1 minute with supervision   From Standing, Reach Forward with Outstretched Arm Can reach forward >12 cm safely (5")   From Standing Position, Pick up Object from Floor Able to pick up shoe, needs supervision   From Standing Position, Turn to Look Behind Over each Shoulder Looks behind from both sides and weight shifts well   Turn 360 Degrees Able to turn 360  degrees safely but slowly   Standing Unsupported, Alternately Place Feet on Step/Stool Able to stand independently and complete 8 steps >20 seconds   Standing Unsupported, One Foot in Front Able to plae foot ahead of the other independently and hold 30 seconds  supervision   Standing on One Leg Tries to lift leg/unable to hold 3 seconds but remains standing independently   Total Score 44     Lumbar Exercises: Aerobic   Stationary Bike Nustep L 3 8 minutes      Moist Heat Therapy   Number Minutes Moist Heat 15 Minutes   Moist Heat Location Lumbar Spine     Electrical Stimulation   Electrical Stimulation Location Lumbar   Electrical Stimulation Action IFC    Electrical Stimulation Parameters to tolerance   Electrical Stimulation Goals Pain                  PT Short Term Goals - 07/26/16 1130      PT SHORT TERM GOAL #1   Title Pt will be I with HEP for general back  care    Time 4   Period Weeks   Status On-going     PT SHORT TERM GOAL #2   Title Pt will complete Berg or other balance screen and understand results.    Baseline BERG 44/56, pt wants to use cane but is not yet safe    Time 4   Period Weeks   Status Partially Met     PT SHORT TERM GOAL #3   Title Pt will be able to report 10-15 less pain in back with simple ADLs, standing  for< 10 min   Time 4   Period Weeks   Status Unable to assess           PT Long Term Goals - 07/17/16 1416      PT LONG TERM GOAL #1   Title Pt will be able to demo and understand proper posture, implications for back care, bone density.    Status On-going     PT LONG TERM GOAL #2   Title Pt will be able to sit for 30 min with support and only min increase in back pain for meals, recreation.    Status On-going     PT LONG TERM GOAL #3   Title Berg goal to be set based on screen results    Status Unable to assess     PT LONG TERM GOAL #4   Title FOTO score will improve to less than 70% impaired for overall functional improvement.    Status On-going               Plan - 07/26/16 1127    Clinical Impression Statement BERG 44/56, trial of SPC and small base quad cane today with pt having difficulty sequencing and demonstrates decreased safety. Several episodes of falling asleep when sitting on Mat table today. She    PT Next Visit Plan Pt may bring her TENS so we can put the pads on for her; gait, balance watch her walk with the cane, wants to try wide base quad cane    PT Home Exercise Plan trunk rot, hamstring, knee to chest , PPT    Consulted and Agree with Plan of Care Patient      Patient will benefit from skilled therapeutic intervention in order to improve the following deficits and impairments:  Decreased endurance, Abnormal gait, Decreased activity tolerance, Decreased balance, Decreased range of motion, Postural  dysfunction, Impaired flexibility, Improper body mechanics, Difficulty  walking, Decreased mobility, Decreased strength, Impaired sensation  Visit Diagnosis: Bilateral low back pain with bilateral sciatica, unspecified chronicity  Difficulty in walking, not elsewhere classified  Muscle weakness (generalized)  Radiculopathy, lumbar region     Problem List Patient Active Problem List   Diagnosis Date Noted  . Acute encephalopathy 12/13/2015  . Chronic pain 12/13/2015  . Substance abuse 12/13/2015  . Prolonged Q-T interval on ECG 12/12/2015  . Accidental overdose 12/12/2015  . Fall 11/27/2015  . Generalized weakness 11/27/2015  . Hyponatremia 11/27/2015  . Frequent falls   . Altered mental status 08/19/2013  . Seizures ([place]) 08/19/2013  . Aphasia complicating stroke 88/82/8003  . Essential hypertension, benign 08/19/2013  . Hypokalemia 08/19/2013  . Marijuana use 08/19/2013  . Fibromyalgia 08/19/2013    Dorene Ar, PTA 07/26/2016, 11:44 AM  [facility] [address] [place], [place], [address] Phone: [phone]   Fax:  [phone]  Name: [name] [name] MRN: [id number] Date of Birth: 1950-11-09

## 2016-08-03 ENCOUNTER — Ambulatory Visit: Payer: Medicare HMO | Admitting: Physical Therapy

## 2016-08-03 DIAGNOSIS — M6281 Muscle weakness (generalized): Secondary | ICD-10-CM

## 2016-08-03 DIAGNOSIS — M5416 Radiculopathy, lumbar region: Secondary | ICD-10-CM

## 2016-08-03 DIAGNOSIS — R262 Difficulty in walking, not elsewhere classified: Secondary | ICD-10-CM

## 2016-08-03 DIAGNOSIS — M5441 Lumbago with sciatica, right side: Secondary | ICD-10-CM

## 2016-08-03 DIAGNOSIS — M5442 Lumbago with sciatica, left side: Secondary | ICD-10-CM | POA: Diagnosis not present

## 2016-08-03 NOTE — Therapy (Signed)
[place], [place], [address] Phone: [phone]   Fax:  [phone]  Physical Therapy Treatment  Patient Details  Name: [name] [name] MRN: [id number] Date of Birth: 14-Jan-1950 Referring Provider: Dr. Precious Haws   Encounter Date: 08/03/2016      PT End of Session - 08/03/16 1035    Visit Number 5   Number of Visits 8   Date for PT Re-Evaluation 08/06/16   PT Start Time 2778   PT Stop Time 1115   PT Time Calculation (min) 60 min      Past Medical History:  Diagnosis Date  . Allergy   . Anxiety   . Arthritis    lower back, hands  . Depression   . Fibromyalgia   . Fibromyalgia   . GERD (gastroesophageal reflux disease)   . Hx of bronchitis   . Hypertension   . Neuromuscular disorder (HCC)    fibromyalgia  . Osteoporosis   . Seizures ([place])    last one 11/24/16 states that they come and go  . Sleep apnea    Does not use CPAP  . Stroke [facility]) 2015   some aphasia  . Substance abuse     Past Surgical History:  Procedure Laterality Date  . BREAST LUMPECTOMY Right    benign  . CHOLECYSTECTOMY    . TONSILLECTOMY    . WISDOM TOOTH EXTRACTION      There were no vitals filed for this visit.      Subjective Assessment - 08/03/16 1240    Subjective Can you call my MD and tell them to write an order for a quad cane?   Currently in Pain? Yes   Pain Score 8    Pain Location Back   Pain Orientation Right;Left;Lower   Aggravating Factors  activity, walking   Pain Relieving Factors TENS                          [facility] Adult PT Treatment/Exercise - 08/03/16 0001      Lumbar Exercises: Stretches   Single Knee to Chest Stretch 2 reps;30 seconds   Lower Trunk Rotation 5 reps;10 seconds     Lumbar Exercises: Aerobic   Stationary Bike L3 x 5 minutes      Lumbar Exercises: Standing   Heel Raises 10 reps   Other Standing Lumbar Exercises standing hip abduction and extension x  10 each bilateral      Lumbar Exercises: Supine   Ab Set 10 reps   Clam 10 reps   Bent Knee Raise 10 reps   Bridge 10 reps   Bridge Limitations with abdominal draw in    Straight Leg Raise 10 reps   Straight Leg Raises Limitations with abdominal draw in    Other Supine Lumbar Exercises clam with green band and abdominal draw in x 10     Moist Heat Therapy   Number Minutes Moist Heat 15 Minutes   Moist Heat Location Lumbar Spine     Electrical Stimulation   Electrical Stimulation Location Lumbar   Electrical Stimulation Action IFC    Electrical Stimulation Parameters to tolerance    Electrical Stimulation Goals Pain                  PT Short Term Goals - 08/03/16 1048      PT SHORT TERM GOAL #1   Title Pt will be I with HEP for general back care  Time 4   Period Weeks   Status Partially Met     PT SHORT TERM GOAL #2   Title Pt will complete Berg or other balance screen and understand results.    Baseline BERG 44/56, pt wants to use cane but is not yet safe    Time 4   Period Weeks   Status Partially Met     PT SHORT TERM GOAL #3   Title Pt will be able to report 10-15 less pain in back with simple ADLs, standing  for< 10 min   Baseline no improvement    Time 4   Period Weeks   Status On-going           PT Long Term Goals - 07/17/16 1416      PT LONG TERM GOAL #1   Title Pt will be able to demo and understand proper posture, implications for back care, bone density.    Status On-going     PT LONG TERM GOAL #2   Title Pt will be able to sit for 30 min with support and only min increase in back pain for meals, recreation.    Status On-going     PT LONG TERM GOAL #3   Title Berg goal to be set based on screen results    Status Unable to assess     PT LONG TERM GOAL #4   Title FOTO score will improve to less than 70% impaired for overall functional improvement.    Status On-going               Plan - 08/03/16 1036    Clinical  Impression Statement Called MD office and requested order for wide based quad cane. Still concerned with patient's safety without RW due to altertness. Began standing hip strengthening with two episodes of knees buckling requiring min assist to prevent LOB. For mat exercises she requires cues to stay alert and perform a instructed. Repeated IFC as pt cannot don TENS independently at home. Pt reports no change in back pain with ADLs. She would like to increase frequncy to 2 x per week. Set BERG goal    PT Next Visit Plan Pt may bring her TENS so we can put the pads on for her; gait, balance watch her walk with the cane, wants to try wide base quad cane, order requested. also wants to increased tp 2 x per week, need new cert?    PT Home Exercise Plan trunk rot, hamstring, knee to chest , PPT    Consulted and Agree with Plan of Care Patient      Patient will benefit from skilled therapeutic intervention in order to improve the following deficits and impairments:  Decreased endurance, Abnormal gait, Decreased activity tolerance, Decreased balance, Decreased range of motion, Postural dysfunction, Impaired flexibility, Improper body mechanics, Difficulty walking, Decreased mobility, Decreased strength, Impaired sensation  Visit Diagnosis: Bilateral low back pain with bilateral sciatica, unspecified chronicity  Difficulty in walking, not elsewhere classified  Muscle weakness (generalized)  Radiculopathy, lumbar region     Problem List Patient Active Problem List   Diagnosis Date Noted  . Acute encephalopathy 12/13/2015  . Chronic pain 12/13/2015  . Substance abuse 12/13/2015  . Prolonged Q-T interval on ECG 12/12/2015  . Accidental overdose 12/12/2015  . Fall 11/27/2015  . Generalized weakness 11/27/2015  . Hyponatremia 11/27/2015  . Frequent falls   . Altered mental status 08/19/2013  . Seizures ([place]) 08/19/2013  . Aphasia complicating stroke 36/62/9476  .  Essential hypertension, benign  08/19/2013  . Hypokalemia 08/19/2013  . Marijuana use 08/19/2013  . Fibromyalgia 08/19/2013    Dorene Ar, PTA 08/03/2016, 12:41 PM  [facility] [address] [place], [place], [address] Phone: [phone]   Fax:  [phone]  Name: Gerardo Territo MRN: [id number] Date of Birth: 19-Oct-1950

## 2016-08-08 ENCOUNTER — Ambulatory Visit: Payer: Medicare HMO | Attending: Family Medicine | Admitting: Physical Therapy

## 2016-08-08 ENCOUNTER — Ambulatory Visit: Payer: Medicare HMO | Admitting: Physical Therapy

## 2016-08-08 DIAGNOSIS — M5441 Lumbago with sciatica, right side: Secondary | ICD-10-CM | POA: Insufficient documentation

## 2016-08-08 DIAGNOSIS — R262 Difficulty in walking, not elsewhere classified: Secondary | ICD-10-CM

## 2016-08-08 DIAGNOSIS — M6281 Muscle weakness (generalized): Secondary | ICD-10-CM | POA: Diagnosis present

## 2016-08-08 DIAGNOSIS — M5442 Lumbago with sciatica, left side: Secondary | ICD-10-CM | POA: Insufficient documentation

## 2016-08-08 DIAGNOSIS — M5416 Radiculopathy, lumbar region: Secondary | ICD-10-CM

## 2016-08-08 NOTE — Therapy (Addendum)
[place], [place], [address] Phone: [phone]   Fax:  [phone]  Physical Therapy Treatment/Renewal   Patient Details  Name: [name] [name] MRN: [id number] Date of Birth: [birth year] Referring Provider: Dr. Precious Haws   Encounter Date: 08/08/2016      PT End of Session - 08/08/16 1332    Visit Number 6   Number of Visits 8   Date for PT Re-Evaluation 09/21/16   PT Start Time 1324  24 minutes late    PT Stop Time 1350   PT Time Calculation (min) 26 min   Activity Tolerance Patient limited by pain   Behavior During Therapy [facility] for tasks assessed/performed      Past Medical History:  Diagnosis Date  . Allergy   . Anxiety   . Arthritis    lower back, hands  . Depression   . Fibromyalgia   . Fibromyalgia   . GERD (gastroesophageal reflux disease)   . Hx of bronchitis   . Hypertension   . Neuromuscular disorder (HCC)    fibromyalgia  . Osteoporosis   . Seizures ([place])    last one 11/24/16 states that they come and go  . Sleep apnea    Does not use CPAP  . Stroke [facility]) 2015   some aphasia  . Substance abuse     Past Surgical History:  Procedure Laterality Date  . BREAST LUMPECTOMY Right    benign  . CHOLECYSTECTOMY    . TONSILLECTOMY    . WISDOM TOOTH EXTRACTION      There were no vitals filed for this visit.      Subjective Assessment - 08/08/16 1327    Subjective Back is hurting a lot.    Currently in Pain? Yes   Pain Score 10-Worst pain ever   Pain Location Back   Pain Orientation Right;Left;Lower   Pain Descriptors / Indicators Aching;Stabbing   Pain Type Chronic pain                [facility] Adult PT Treatment/Exercise - 08/09/16 0001      Lumbar Exercises: Stretches   Single Knee to Chest Stretch 2 reps;30 seconds   Lower Trunk Rotation 5 reps;10 seconds   Pelvic Tilt 10 seconds     Lumbar Exercises: Supine   Straight Leg Raise 10 reps   Straight Leg Raises  Limitations with abdominal draw in   Other Supine Lumbar Exercises clam with green band and abdominal draw in x 10, ball squeeze x 10               PT Short Term Goals - 08/03/16 1048      PT SHORT TERM GOAL #1   Title Pt will be I with HEP for general back care    Time 4   Period Weeks   Status Partially Met     PT SHORT TERM GOAL #2   Title Pt will complete Berg or other balance screen and understand results.    Baseline BERG 44/56, pt wants to use cane but is not yet safe    Time 4   Period Weeks   Status Partially Met     PT SHORT TERM GOAL #3   Title Pt will be able to report 10-15 less pain in back with simple ADLs, standing  for< 10 min   Baseline no improvement    Time 4   Period Weeks   Status On-going  PT Long Term Goals - 08/08/16 1406      PT LONG TERM GOAL #1   Title Pt will be able to demo and understand proper posture, implications for back care, bone density.    Time 8   Period Weeks   Status On-going     PT LONG TERM GOAL #2   Title Pt will be able to sit for 30 min with support and only min increase in back pain for meals, recreation.    Time 8   Period Weeks   Status On-going     PT LONG TERM GOAL #3   Title [name] goal to be set based on screen results    Time 8   Period Weeks   Status Unable to assess     PT LONG TERM GOAL #4   Title FOTO score will improve to less than 70% impaired for overall functional improvement.    Baseline 78% 08/08/2016   Time 8   Period Weeks               Plan - 08/08/16 1358    Clinical Impression Statement Pt enters 24 minutes late without her RW. She lost her balance to the side and recovered by using wall to assist. She was HHA the rest of the treatment. She reports 10/10 pain today maybe because she had to clean up a spill on her floor last night. Her FOTO score improved from 84% limited to 78% limited. She continues with hip and core weakness and decreased safety. ADLs are limited by  pain.    PT Next Visit Plan ; gait, balance watch her walk with the cane, wants to try wide base quad cane, order requested. also wants to increased to 2 x per week,   PT Home Exercise Plan trunk rot, hamstring, knee to chest , PPT    Consulted and Agree with Plan of Care Patient      Patient will benefit from skilled therapeutic intervention in order to improve the following deficits and impairments:  Decreased endurance, Abnormal gait, Decreased activity tolerance, Decreased balance, Decreased range of motion, Postural dysfunction, Impaired flexibility, Improper body mechanics, Difficulty walking, Decreased mobility, Decreased strength, Impaired sensation  Visit Diagnosis: Bilateral low back pain with bilateral sciatica, unspecified chronicity  Difficulty in walking, not elsewhere classified  Muscle weakness (generalized)  Radiculopathy, lumbar region     Problem List Patient Active Problem List   Diagnosis Date Noted  . Acute encephalopathy 12/13/2015  . Chronic pain 12/13/2015  . Substance abuse 12/13/2015  . Prolonged Q-T interval on ECG 12/12/2015  . Accidental overdose 12/12/2015  . Fall 11/27/2015  . Generalized weakness 11/27/2015  . Hyponatremia 11/27/2015  . Frequent falls   . Altered mental status 08/19/2013  . Seizures ([place]) 08/19/2013  . Aphasia complicating stroke 44/92/0100  . Essential hypertension, benign 08/19/2013  . Hypokalemia 08/19/2013  . Marijuana use 08/19/2013  . Fibromyalgia 08/19/2013    PAA,JENNIFER , PTA 08/09/2016, 12:03 PM  [place], [place], [address] Phone: [phone]   Fax:  [phone]  Name: [name] [name] MRN: [id number] Date of Birth: 03-11-1950  Raeford Razor, PT 08/09/16 12:05 PM Phone: [phone] Fax: [phone]

## 2016-08-09 DIAGNOSIS — M5442 Lumbago with sciatica, left side: Secondary | ICD-10-CM | POA: Diagnosis not present

## 2016-08-09 NOTE — Addendum Note (Signed)
Addended by: Raeford Razor L on: 08/09/2016 12:06 PM   Modules accepted: Orders

## 2016-08-10 ENCOUNTER — Ambulatory Visit: Payer: Medicare HMO | Admitting: Physical Therapy

## 2016-08-10 VITALS — BP 170/115

## 2016-08-10 DIAGNOSIS — R262 Difficulty in walking, not elsewhere classified: Secondary | ICD-10-CM

## 2016-08-10 DIAGNOSIS — M6281 Muscle weakness (generalized): Secondary | ICD-10-CM

## 2016-08-10 DIAGNOSIS — M5442 Lumbago with sciatica, left side: Principal | ICD-10-CM

## 2016-08-10 DIAGNOSIS — M5441 Lumbago with sciatica, right side: Secondary | ICD-10-CM

## 2016-08-10 DIAGNOSIS — M5416 Radiculopathy, lumbar region: Secondary | ICD-10-CM

## 2016-08-10 NOTE — Therapy (Signed)
[name] [name], [place], [address] Phone: [phone]   Fax:  [phone]  Physical Therapy Treatment  Patient Details  Name: [name] [name] MRN: [id number] Date of Birth: 1950/04/09 Referring Provider: Dr. Precious [name]   Encounter Date: 08/10/2016      PT End of Session - 08/10/16 1011    Visit Number 7   Number of Visits 8   Date for PT Re-Evaluation 09/21/16   PT Start Time 1005   PT Stop Time 1100   PT Time Calculation (min) 55 min      Past Medical History:  Diagnosis Date  . Allergy   . Anxiety   . Arthritis    lower back, hands  . Depression   . Fibromyalgia   . Fibromyalgia   . GERD (gastroesophageal reflux disease)   . Hx of bronchitis   . Hypertension   . Neuromuscular disorder (HCC)    fibromyalgia  . Osteoporosis   . Seizures ([place])    last one 11/24/16 states that they come and go  . Sleep apnea    Does not use CPAP  . Stroke [facility]) 2015   some aphasia  . Substance abuse     Past Surgical History:  Procedure Laterality Date  . BREAST LUMPECTOMY Right    benign  . CHOLECYSTECTOMY    . TONSILLECTOMY    . WISDOM TOOTH EXTRACTION      There were no vitals filed for this visit.      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 "    Pertinent History opiate use, seizures, stroke, fibromyalgia   Currently in Pain? Yes   Pain Score --  20/10   Pain Location Back   Pain Orientation Right;Left;Lower                         [facility] Adult PT Treatment/Exercise - 08/10/16 0001      Lumbar Exercises: Stretches   Passive Hamstring Stretch 2 reps;20 seconds   Single Knee to Chest Stretch 2 reps;20 seconds   Lower Trunk Rotation 5 reps;10 seconds   Pelvic Tilt 10 seconds     Lumbar Exercises: Aerobic   Stationary Bike L4 x 5 minutes      Lumbar Exercises: Supine   Glut Set 10 reps   Glut Set Limitations with mini bridge    Bent Knee Raise 10 reps   Bent Knee Raise Limitations with abdominal draw in    Straight Leg Raise 10 reps   Straight Leg Raises Limitations with abdominal draw in   Large Ball Abdominal Isometric 10 reps   Other Supine Lumbar Exercises clam with green band and abdominal draw in x 10, ball squeeze x 10                   PT Short Term Goals - 08/03/16 1048      PT SHORT TERM GOAL #1   Title Pt will be I with HEP for general back care    Time 4   Period Weeks   Status Partially Met     PT SHORT TERM GOAL #2   Title Pt will complete Berg or other balance screen and understand results.    Baseline BERG 44/56, pt wants to use cane but is not yet safe  Time 4   Period Weeks   Status Partially Met     PT SHORT TERM GOAL #3   Title Pt will be able to report 10-15 less pain in back with simple ADLs, standing  for< 10 min   Baseline no improvement    Time 4   Period Weeks   Status On-going           PT Long Term Goals - 08/08/16 1406      PT LONG TERM GOAL #1   Title Pt will be able to demo and understand proper posture, implications for back care, bone density.    Time 8   Period Weeks   Status On-going     PT LONG TERM GOAL #2   Title Pt will be able to sit for 30 min with support and only min increase in back pain for meals, recreation.    Time 8   Period Weeks   Status On-going     PT LONG TERM GOAL #3   Title [name] [name] goal to be set based on screen results    Time 8   Period Weeks   Status Unable to assess     PT LONG TERM GOAL #4   Title FOTO score will improve to less than 70% impaired for overall functional improvement.    Baseline 78% 08/08/2016   Time 8   Period Weeks               Plan - 08/10/16 1012    Clinical Impression Statement Pt is tearful today and says she did not take her anxietry medicine. She reports 20/10 pain however declines to go to  ED. Performed supine exercises for core/hips. Pt request help with donning home tens and then resumed therex. She has limited tolerance to therex and reports no progress. Offered information on couseling and she refuses and says she has already done it three times.    PT Next Visit Plan FOTO;  gait, balance watch her walk with the cane, wants to try wide base quad cane, order requested.    PT Home Exercise Plan trunk rot, hamstring, knee to chest , PPT    Consulted and Agree with Plan of Care Patient      Patient will benefit from skilled therapeutic intervention in order to improve the following deficits and impairments:  Decreased endurance, Abnormal gait, Decreased activity tolerance, Decreased balance, Decreased range of motion, Postural dysfunction, Impaired flexibility, Improper body mechanics, Difficulty walking, Decreased mobility, Decreased strength, Impaired sensation  Visit Diagnosis: Bilateral low back pain with bilateral sciatica, unspecified chronicity  Difficulty in walking, not elsewhere classified  Radiculopathy, lumbar region  Muscle weakness (generalized)   Problem List Patient Active Problem List   Diagnosis Date Noted  . Acute encephalopathy 12/13/2015  . Chronic pain 12/13/2015  . Substance abuse 12/13/2015  . Prolonged Q-T interval on ECG 12/12/2015  . Accidental overdose 12/12/2015  . Fall 11/27/2015  . Generalized weakness 11/27/2015  . Hyponatremia 11/27/2015  . Frequent falls   . Altered mental status 08/19/2013  . Seizures ([place]) 08/19/2013  . Aphasia complicating stroke 35/78/9784  . Essential hypertension, benign 08/19/2013  . Hypokalemia 08/19/2013  . Marijuana use 08/19/2013  . Fibromyalgia 08/19/2013    Dorene Ar, PTA 08/10/2016, 11:36 AM  [facility] [address] [place], [place], [address] Phone: [phone]   Fax:  [phone]  Name: [name] [name] MRN: [id number] Date of  Birth: 05-26-1950

## 2016-08-15 ENCOUNTER — Ambulatory Visit: Payer: Medicare HMO | Admitting: Physical Therapy

## 2016-08-15 ENCOUNTER — Encounter: Payer: Self-pay | Admitting: Physical Therapy

## 2016-08-15 DIAGNOSIS — M6281 Muscle weakness (generalized): Secondary | ICD-10-CM

## 2016-08-15 DIAGNOSIS — M5416 Radiculopathy, lumbar region: Secondary | ICD-10-CM

## 2016-08-15 DIAGNOSIS — M5442 Lumbago with sciatica, left side: Secondary | ICD-10-CM | POA: Diagnosis not present

## 2016-08-15 DIAGNOSIS — M5441 Lumbago with sciatica, right side: Secondary | ICD-10-CM

## 2016-08-15 DIAGNOSIS — R262 Difficulty in walking, not elsewhere classified: Secondary | ICD-10-CM

## 2016-08-15 NOTE — Therapy (Signed)
[place], [place], [address] Phone: [phone]   Fax:  [phone]  Physical Therapy Treatment  Patient Details  Name: [name] [name] MRN: [id number] Date of Birth: Jul 17, 1950 Referring Provider: Dr. Precious Haws   Encounter Date: 08/15/2016      PT End of Session - 08/15/16 1125    Visit Number 8   Number of Visits 17   Date for PT Re-Evaluation 09/21/16   PT Start Time 1115   PT Stop Time 1208   PT Time Calculation (min) 53 min   Activity Tolerance Patient limited by pain   Behavior During Therapy [facility] for tasks assessed/performed  tearful       Past Medical History:  Diagnosis Date  . Allergy   . Anxiety   . Arthritis    lower back, hands  . Depression   . Fibromyalgia   . Fibromyalgia   . GERD (gastroesophageal reflux disease)   . Hx of bronchitis   . Hypertension   . Neuromuscular disorder (HCC)    fibromyalgia  . Osteoporosis   . Seizures ([place])    last one 11/24/16 states that they come and go  . Sleep apnea    Does not use CPAP  . Stroke [facility]) 2015   some aphasia  . Substance abuse     Past Surgical History:  Procedure Laterality Date  . BREAST LUMPECTOMY Right    benign  . CHOLECYSTECTOMY    . TONSILLECTOMY    . WISDOM TOOTH EXTRACTION      There were no vitals filed for this visit.      Subjective Assessment - 08/15/16 1118    Subjective Things are the same.  I'm thinking of calling my old doctor, they move to [place]    Currently in Pain? Yes   Pain Score 10-Worst pain ever  20/10   Pain Location Back   Pain Orientation Right;Left;Lower   Pain Descriptors / Indicators Stabbing   Pain Type Chronic pain   Pain Onset More than a month ago   Pain Frequency Constant   Aggravating Factors  activity, walking    Pain Relieving Factors TENS, heat              [facility] Adult PT Treatment/Exercise - 08/15/16 0001      Self-Care   Self-Care Other Self-Care  Comments   Other Self-Care Comments  TENs plug in for power   core, abdominal support and stabilization      Lumbar Exercises: Stretches   Lower Trunk Rotation 10 seconds   Lower Trunk Rotation Limitations x 10      Lumbar Exercises: Supine   Ab Set 10 reps   AB Set Limitations legs on large bolster 90/90    Clam 10 reps   Bent Knee Raise 10 reps   Bent Knee Raise Limitations with abdominal draw in    Straight Leg Raise 10 reps   Straight Leg Raises Limitations with abdominal draw in   Large Ball Abdominal Isometric 10 reps   Other Supine Lumbar Exercises overhead reach with magic circle x 10    Other Supine Lumbar Exercises supine horizontal abduction yellow band x 10      Moist Heat Therapy   Number Minutes Moist Heat 15 Minutes  during mat ex   Moist Heat Location Lumbar Spine     Manual Therapy   Soft tissue mobilization grossly to lumbar paraspinals , compression to glutes, piriformis, compression to middle and upper  back, PROM hip ER/IR    Myofascial Release lumbosacral                 PT Education - 08/15/16 1143    Education provided Yes   Education Details charging TENS unit    Person(s) Educated Patient   Methods Explanation   Comprehension Verbalized understanding          PT Short Term Goals - 08/15/16 1212      PT SHORT TERM GOAL #1   Title Pt will be I with HEP for general back care    Baseline stretching mt, given stab today    Status Partially Met     PT SHORT TERM GOAL #2   Title Pt will complete Berg or other balance screen and understand results.    Baseline BERG 44/56, pt wants to use cane but is not yet safe    Status Achieved     PT SHORT TERM GOAL #3   Title Pt will be able to report 10-15 less pain in back with simple ADLs, standing  for< 10 min   Baseline improvement is temporary   Status Partially Met           PT Long Term Goals - 08/15/16 1213      PT LONG TERM GOAL #1   Title Pt will be able to demo and understand  proper posture, implications for back care, bone density.    Status On-going     PT LONG TERM GOAL #2   Title Pt will be able to sit for 30 min with support and only min increase in back pain for meals, recreation.    Status Unable to assess     PT LONG TERM GOAL #3   Title [name] goal is 50/56 to reduce fall risk.    Baseline 44/56   Status On-going     PT LONG TERM GOAL #4   Title FOTO score will improve to less than 70% impaired for overall functional improvement.    Baseline 78% 08/08/2016   Status On-going               Plan - 08/15/16 1146    Clinical Impression Statement Pt continues to rate pain as 20/10.  Able to do mat exercises without pain increasing, including full SLR to 100 deg.  I helped her with her TENS unit, just needed charging.  Pain in low back , upper back improved after manual, light soft tissue work.    PT Frequency 2x / week   PT Treatment/Interventions ADLs/Self Care Home Management;Patient/family education;Gait training;Cryotherapy;Electrical Stimulation;Moist Heat;Ultrasound;Passive range of motion;Neuromuscular re-education;Balance training;Therapeutic exercise;Therapeutic activities;Functional mobility training;Manual techniques;DME Instruction;Other (comment)   PT Next Visit Plan gait, balance and standing core. Check stab I (prepilates ) HEP , did soft tissue work help?    PT Home Exercise Plan trunk rot, hamstring, knee to chest , PPT added prepilates stabilization    Consulted and Agree with Plan of Care Patient      Patient will benefit from skilled therapeutic intervention in order to improve the following deficits and impairments:  Decreased endurance, Abnormal gait, Decreased activity tolerance, Decreased balance, Decreased range of motion, Postural dysfunction, Impaired flexibility, Improper body mechanics, Difficulty walking, Decreased mobility, Decreased strength, Impaired sensation  Visit Diagnosis: Bilateral low back pain with bilateral  sciatica, unspecified chronicity  Difficulty in walking, not elsewhere classified  Muscle weakness (generalized)  Radiculopathy, lumbar region     Problem List Patient Active Problem List  Diagnosis Date Noted  . Acute encephalopathy 12/13/2015  . Chronic pain 12/13/2015  . Substance abuse 12/13/2015  . Prolonged Q-T interval on ECG 12/12/2015  . Accidental overdose 12/12/2015  . Fall 11/27/2015  . Generalized weakness 11/27/2015  . Hyponatremia 11/27/2015  . Frequent falls   . Altered mental status 08/19/2013  . Seizures ([place]) 08/19/2013  . Aphasia complicating stroke 41/44/3601  . Essential hypertension, benign 08/19/2013  . Hypokalemia 08/19/2013  . Marijuana use 08/19/2013  . Fibromyalgia 08/19/2013    PAA,JENNIFER 08/15/2016, 12:24 PM  [facility] [address] [place], [place], [address] Phone: [phone]   Fax:  [phone]  Name: [name] [name] MRN: [id number] Date of Birth: 1950-09-20  Raeford Razor, PT 08/15/16 12:24 PM Phone: [phone] Fax: [phone]

## 2016-08-15 NOTE — Patient Instructions (Signed)
   PELVIC TILT  Lie on back, legs bent. Exhale, tilting top of pelvis back, pubic bone up, to flatten lower back. Inhale, rolling pelvis opposite way, top forward, pubic bone down, arch in back. Repeat __10__ times. Do __2__ sessions per day. Copyright  VHI. All rights reserved.    Isometric Hold With Pelvic Floor (Hook-Lying)  Lie with hips and knees bent. Slowly inhale, and then exhale. Pull navel toward spine and tighten pelvic floor. Hold for __10_ seconds. Continue to breathe in and out during hold. Rest for _10__ seconds. Repeat __10_ times. Do __2-3_ times a day.   Knee Fold  Lie on back, legs bent, arms by sides. Exhale, lifting knee to chest. Inhale, returning. Keep abdominals flat, navel to spine. Repeat __10__ times, alternating legs. Do __2__ sessions per day.  Knee Drop  Keep pelvis stable. Without rotating hips, slowly drop knee to side, pause, return to center, bring knee across midline toward opposite hip. Feel obliques engaging. Repeat for ___10_ times each leg. YOU CAN DO BOTH KNEES OR ONE AT A TIME    Copyright  VHI. All rights reserved.

## 2016-08-22 ENCOUNTER — Ambulatory Visit: Payer: Medicare HMO | Admitting: Physical Therapy

## 2016-08-22 DIAGNOSIS — M5441 Lumbago with sciatica, right side: Secondary | ICD-10-CM

## 2016-08-22 DIAGNOSIS — M5416 Radiculopathy, lumbar region: Secondary | ICD-10-CM

## 2016-08-22 DIAGNOSIS — M6281 Muscle weakness (generalized): Secondary | ICD-10-CM

## 2016-08-22 DIAGNOSIS — M5442 Lumbago with sciatica, left side: Principal | ICD-10-CM

## 2016-08-22 DIAGNOSIS — R262 Difficulty in walking, not elsewhere classified: Secondary | ICD-10-CM

## 2016-08-22 NOTE — Therapy (Addendum)
[place], [place], [address] Phone: [phone]   Fax:  [phone]  Physical Therapy Treatment and Discharge  Patient Details  Name: [name] [name] MRN: [id number] Date of Birth: February 08, 1950 Referring Provider: Dr. Precious Haws   Encounter Date: 08/22/2016      PT End of Session - 08/22/16 1132    Visit Number 9   Number of Visits 17   Date for PT Re-Evaluation 09/21/16   PT Start Time 1120  20 minutes late    PT Stop Time 1200   PT Time Calculation (min) 40 min      Past Medical History:  Diagnosis Date  . Allergy   . Anxiety   . Arthritis    lower back, hands  . Depression   . Fibromyalgia   . Fibromyalgia   . GERD (gastroesophageal reflux disease)   . Hx of bronchitis   . Hypertension   . Neuromuscular disorder (HCC)    fibromyalgia  . Osteoporosis   . Seizures ([place])    last one 11/24/16 states that they come and go  . Sleep apnea    Does not use CPAP  . Stroke [facility]) 2015   some aphasia  . Substance abuse     Past Surgical History:  Procedure Laterality Date  . BREAST LUMPECTOMY Right    benign  . CHOLECYSTECTOMY    . TONSILLECTOMY    . WISDOM TOOTH EXTRACTION      There were no vitals filed for this visit.      Subjective Assessment - 08/22/16 1258    Subjective I am referred to the pain clinic.    Currently in Pain? Yes   Pain Score 10-Worst pain ever  10/20 per pt    Pain Location Back   Pain Orientation Right;Left;Lower   Pain Descriptors / Indicators Stabbing   Pain Type Chronic pain   Aggravating Factors  all activity, walking    Pain Relieving Factors TENS, heat                          [facility] Adult PT Treatment/Exercise - 08/22/16 0001      Lumbar Exercises: Aerobic   Stationary Bike L4 x 5 minutes      Lumbar Exercises: Supine   Clam 10 reps   Bent Knee Raise 10 reps   Bent Knee Raise Limitations with abdominal draw in    Straight Leg  Raise 10 reps   Straight Leg Raises Limitations with abdominal draw in   Large Ball Abdominal Isometric 10 reps   Large Ball Oblique Isometric 10 reps     Moist Heat Therapy   Number Minutes Moist Heat 15 Minutes   Moist Heat Location Lumbar Spine                  PT Short Term Goals - 08/15/16 1212      PT SHORT TERM GOAL #1   Title Pt will be I with HEP for general back care    Baseline stretching mt, given stab today    Status Partially Met     PT SHORT TERM GOAL #2   Title Pt will complete [place] or other balance screen and understand results.    Baseline BERG 44/56, pt wants to use cane but is not yet safe    Status Achieved     PT SHORT TERM GOAL #3   Title Pt will be able to  report 10-15 less pain in back with simple ADLs, standing  for< 10 min   Baseline improvement is temporary   Status Partially Met           PT Long Term Goals - 08/15/16 1213      PT LONG TERM GOAL #1   Title Pt will be able to demo and understand proper posture, implications for back care, bone density.    Status On-going     PT LONG TERM GOAL #2   Title Pt will be able to sit for 30 min with support and only min increase in back pain for meals, recreation.    Status Unable to assess     PT LONG TERM GOAL #3   Title [name] goal is 50/56 to reduce fall risk.    Baseline 44/56   Status On-going     PT LONG TERM GOAL #4   Title FOTO score will improve to less than 70% impaired for overall functional improvement.    Baseline 78% 08/08/2016   Status On-going               Plan - 08/22/16 1134    Clinical Impression Statement Pt arrives 20 minutes late due to missing the bus. Pt rates pain as 10/20, able to grocerry shop for 2 hours yesterday using cart and also ran 2 errands.  She has requested her MD to add Neck pain to her PT diagnosis. We still have not received wide based quad cane order after requesting 2 times. She reports lumbar massage helpful last visit and allowed  her to take a nap.    PT Next Visit Plan gait, balance and standing core. Check stab I (prepilates ) HEP , did soft tissue work help?    PT Home Exercise Plan trunk rot, hamstring, knee to chest , PPT added prepilates stabilization    Consulted and Agree with Plan of Care Patient      Patient will benefit from skilled therapeutic intervention in order to improve the following deficits and impairments:  Decreased endurance, Abnormal gait, Decreased activity tolerance, Decreased balance, Decreased range of motion, Postural dysfunction, Impaired flexibility, Improper body mechanics, Difficulty walking, Decreased mobility, Decreased strength, Impaired sensation  Visit Diagnosis: Bilateral low back pain with bilateral sciatica, unspecified chronicity  Difficulty in walking, not elsewhere classified  Muscle weakness (generalized)  Radiculopathy, lumbar region     Problem List Patient Active Problem List   Diagnosis Date Noted  . Acute encephalopathy 12/13/2015  . Chronic pain 12/13/2015  . Substance abuse 12/13/2015  . Prolonged Q-T interval on ECG 12/12/2015  . Accidental overdose 12/12/2015  . Fall 11/27/2015  . Generalized weakness 11/27/2015  . Hyponatremia 11/27/2015  . Frequent falls   . Altered mental status 08/19/2013  . Seizures ([place]) 08/19/2013  . Aphasia complicating stroke 58/85/0277  . Essential hypertension, benign 08/19/2013  . Hypokalemia 08/19/2013  . Marijuana use 08/19/2013  . Fibromyalgia 08/19/2013    Dorene Ar , PTA [facility] [address] [place], [place], [address] Phone: [phone]   Fax:  [phone]  Name: [name] [name] MRN: [id number] Date of Birth: 18-May-1950   PHYSICAL THERAPY DISCHARGE SUMMARY  Visits from Start of Care: 9  Current functional level related to goals / functional outcomes: See above for most recent info    Remaining deficits: Pain, spine stiffness,  strength, endurance  Education / Equipment: HEP, gait, posture Plan: Patient agrees to discharge.  Patient goals were partially met.  Patient is being discharged due to not returning since the last visit.  ?????   Unable to speak with Patient when called, poor attendance.  Raeford Razor, PT 10/10/16 3:09 PM Phone: [phone] Fax: [phone]

## 2016-08-24 ENCOUNTER — Encounter: Payer: Self-pay | Admitting: Physical Therapy

## 2016-08-29 ENCOUNTER — Ambulatory Visit: Payer: Medicare HMO | Admitting: Physical Therapy

## 2016-08-31 ENCOUNTER — Telehealth: Payer: Self-pay | Admitting: Physical Therapy

## 2016-08-31 ENCOUNTER — Ambulatory Visit: Payer: Medicare HMO | Admitting: Physical Therapy

## 2016-08-31 NOTE — Telephone Encounter (Signed)
"  The person you are calling cannot accept calls at this time" was heard when number provided was dialed. Attempted to contact regarding NS apt today.  Ligaya Cormier C. Malayiah Mcbrayer PT, DPT 08/31/16 11:27 AM

## 2016-09-05 ENCOUNTER — Telehealth: Payer: Self-pay | Admitting: Physical Therapy

## 2016-09-05 ENCOUNTER — Ambulatory Visit: Payer: Medicare HMO | Admitting: Physical Therapy

## 2016-09-05 NOTE — Telephone Encounter (Signed)
"  The person you are calling cannot accept calls at this time" is heard when dialing provided phone number. This is the pt second no show and, per policy, remaining appointments will be cancelled and she will be allowed to schedule one appointment at a time.  Khadim Lundberg C. Kesha Hurrell PT, DPT 09/05/16 12:12 PM

## 2016-09-07 ENCOUNTER — Ambulatory Visit: Payer: Medicare HMO | Admitting: Physical Therapy

## 2016-12-22 IMAGING — CT CT HEAD W/O CM
3 of 8 series · 13 of 47 positions shown, 15 images · non-contrast
Comparison: Head and cervical spine CT scans 11/27/2015.

CLINICAL DATA: Status post fall with lateral neck and upper back
pain.

EXAM:
CT HEAD WITHOUT CONTRAST
CT CERVICAL SPINE WITHOUT CONTRAST
TECHNIQUE: Multidetector CT imaging of the head and cervical spine was
performed following the standard protocol without intravenous
contrast. Multiplanar CT image reconstructions of the cervical spine
were also generated.

[Series 9: coronal · coronal · 0.33mm/px · 3 of 74 slices shown]
[im 7/74  brain]
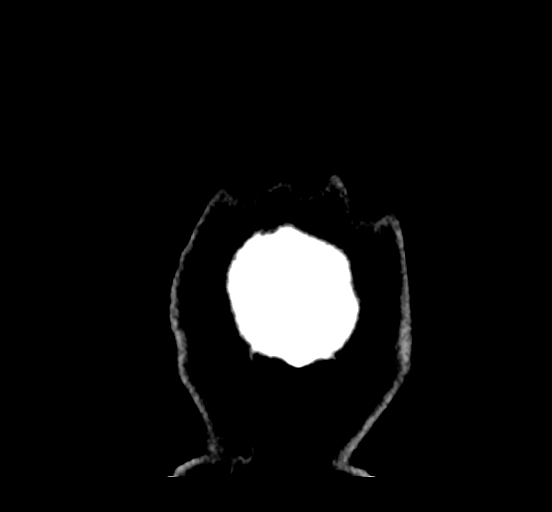
[im 10/74  brain]
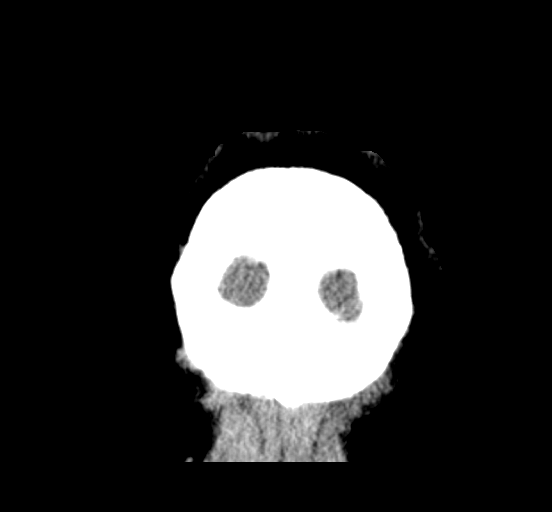
[im 12/74  brain]
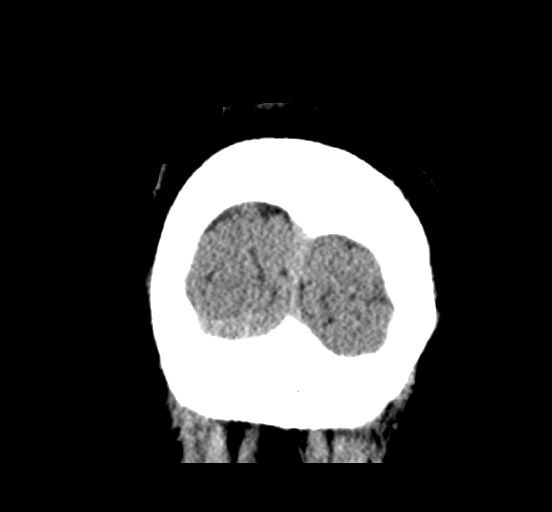

[Series 10: sagittal · sagittal · 0.33mm/px · 2 of 67 slices shown]
[im 23/67  brain]
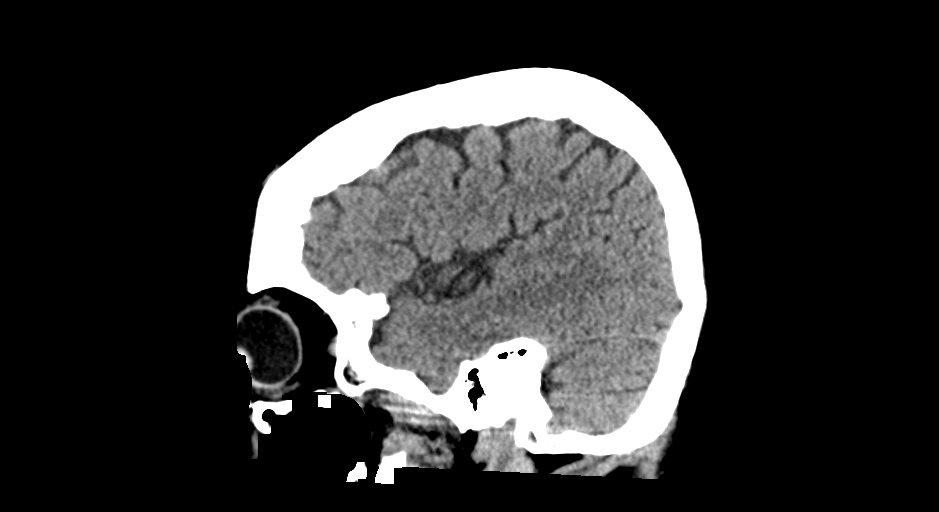
[im 45/67  brain]
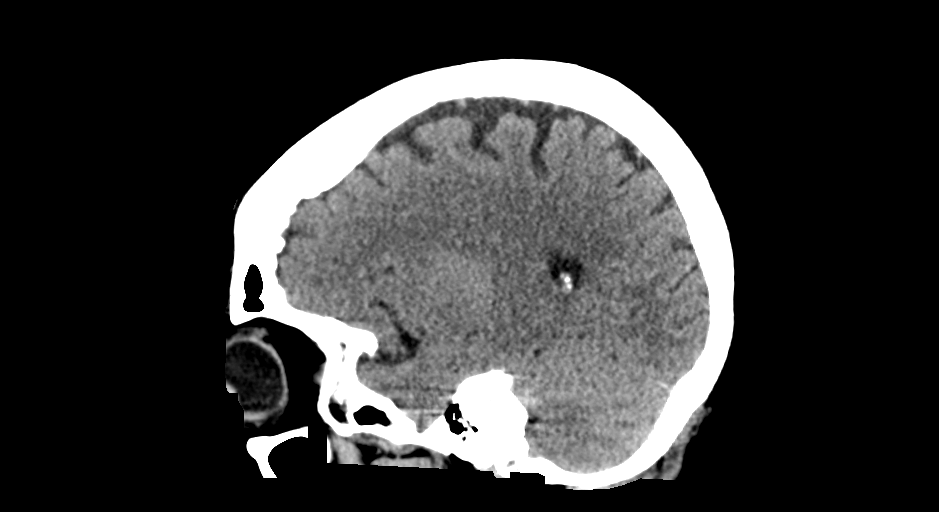

[Series 11: axial recon · axial · 0.23mm/px · z∈[+1544,+1707]mm · 8 of 107 slices shown, 10 images]
[im 10/107  brain]
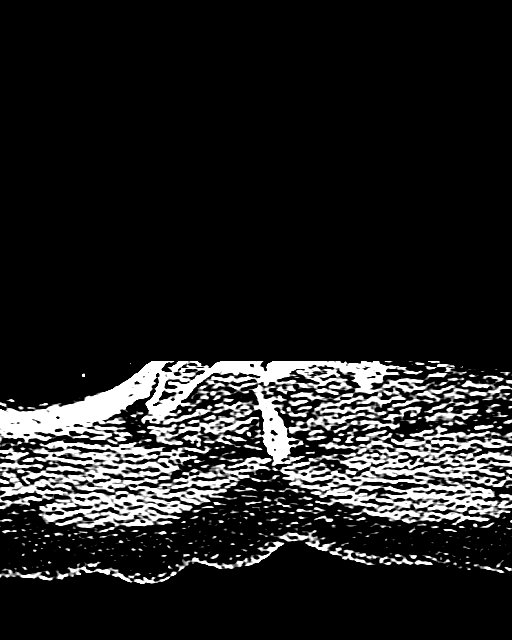
[im 10/107  bone]
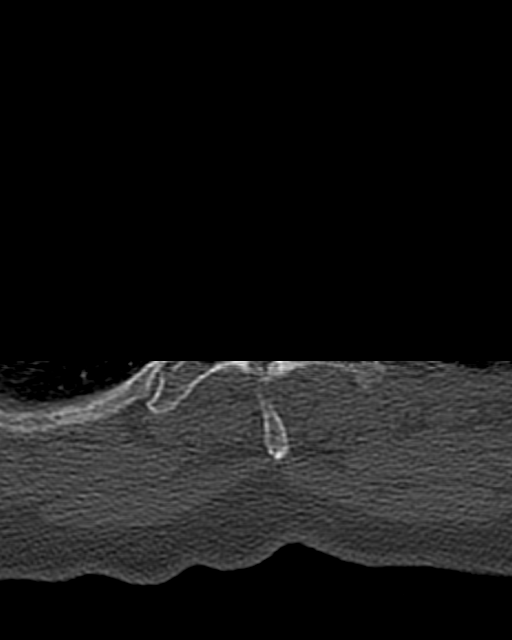
[im 20/107  brain]
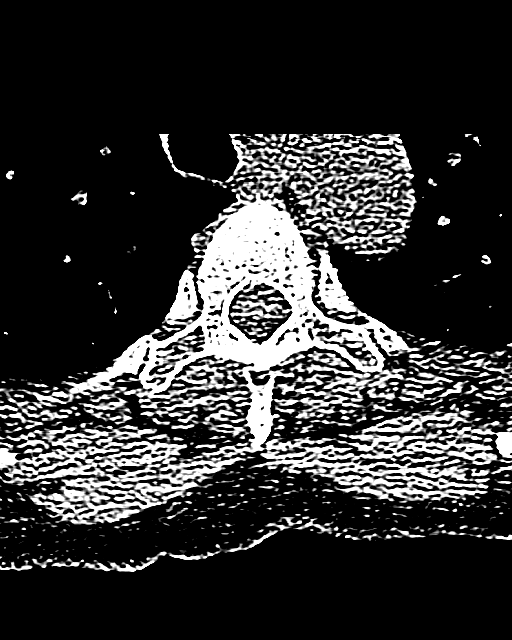
[im 39/107  brain]
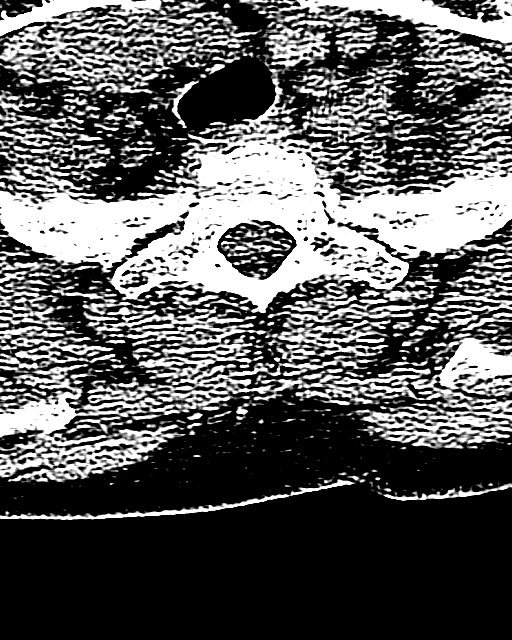
[im 49/107  brain]
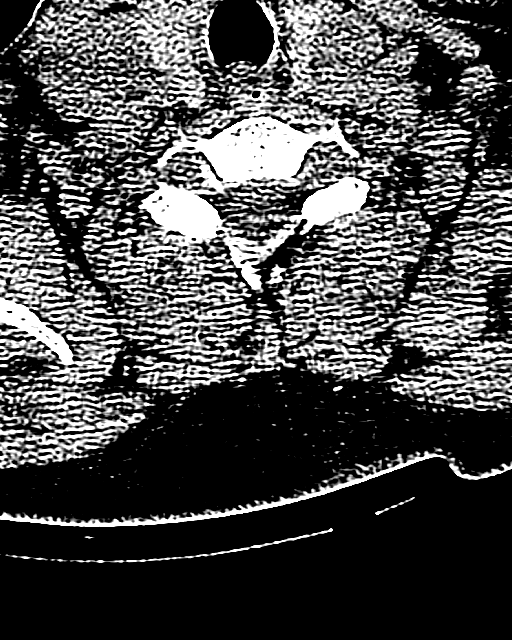
[im 58/107  brain]
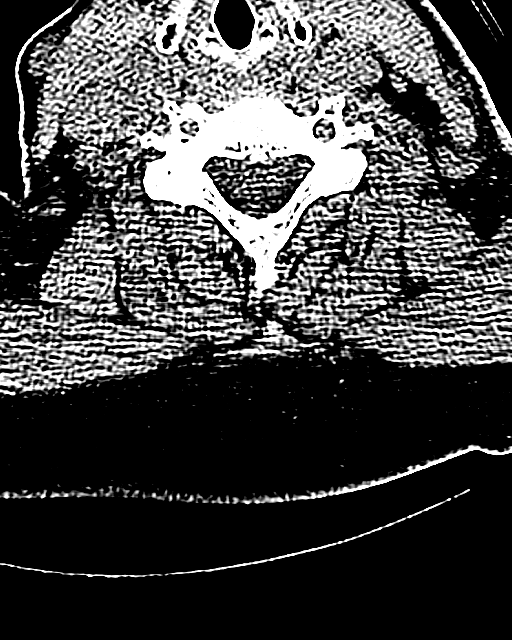
[im 58/107  bone]
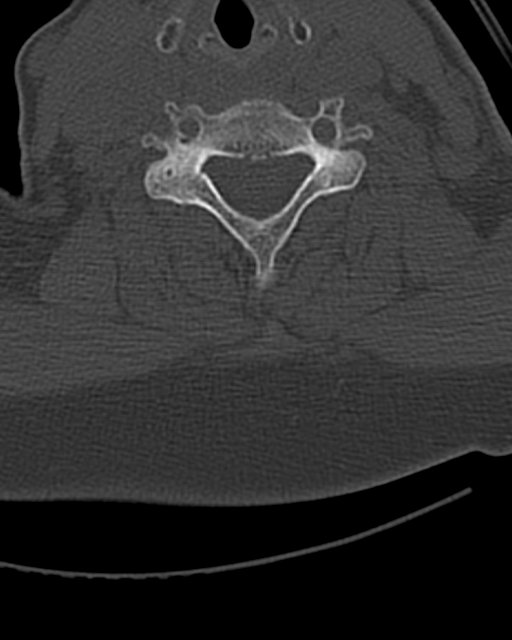
[im 68/107  brain]
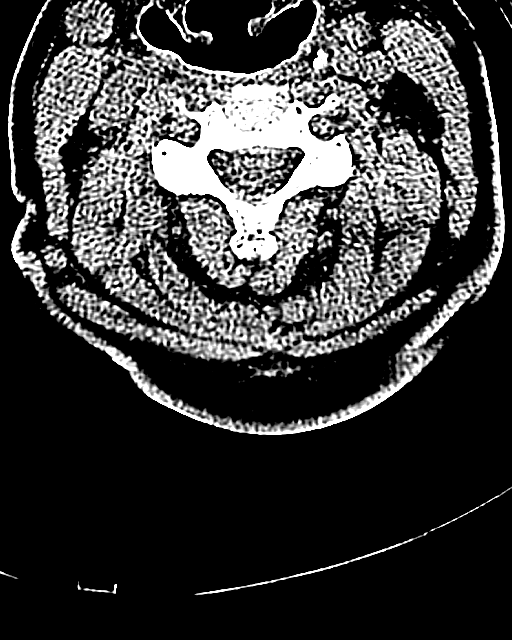
[im 87/107  brain]
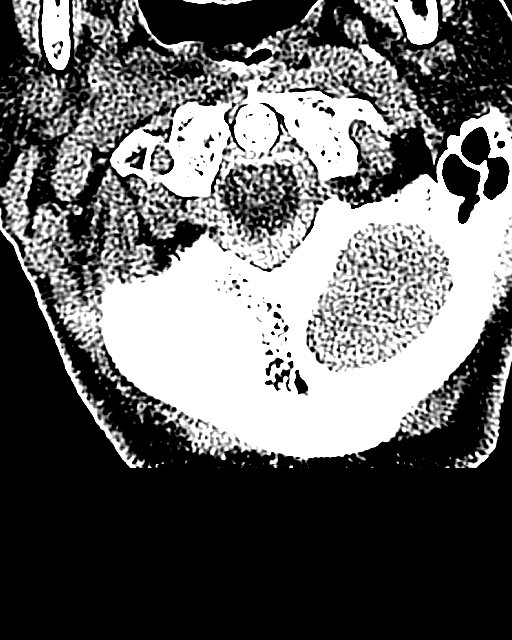
[im 97/107  brain]
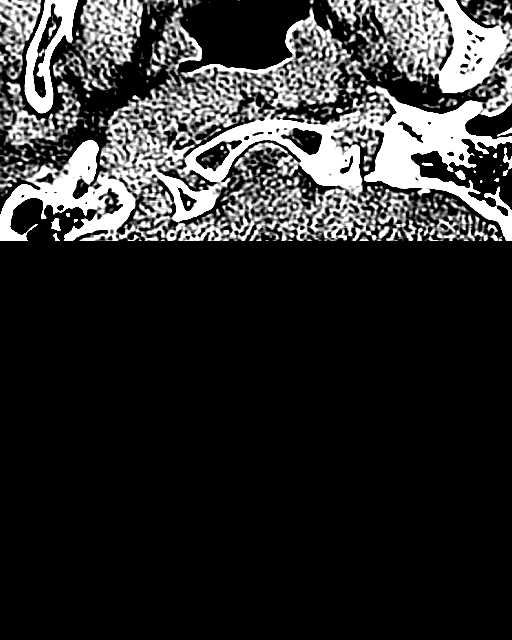

[13 of 47 positions shown; findings below may reference images not displayed]

FINDINGS: CT HEAD FINDINGS

Brain: Appears normal without hemorrhage, infarct, mass lesion, mass
effect, midline shift or abnormal extra-axial fluid collection. No
hydrocephalus or pneumocephalus.

Vascular: Atherosclerosis noted.

Skull: Intact.

Sinuses/Orbits: Negative.

Other: None.

CT CERVICAL SPINE FINDINGS

Alignment: Normal.

Skull base and vertebrae: No acute fracture. No primary bone lesion
or focal pathologic process.

Soft tissues and spinal canal: No prevertebral fluid or swelling. No
visible canal hematoma.

Disc levels: Intervertebral disc space height is maintained. There
is scattered mild to moderate facet degenerative change most notable
on the right at C4-5.

Upper chest: Emphysematous change is identified.

Other: Atherosclerosis noted.
IMPRESSION: No acute abnormality head or cervical spine.

Atherosclerosis.

Emphysema.

## 2017-11-26 ENCOUNTER — Other Ambulatory Visit: Payer: Self-pay | Admitting: Family

## 2017-11-26 DIAGNOSIS — Z13820 Encounter for screening for osteoporosis: Secondary | ICD-10-CM

## 2018-01-10 ENCOUNTER — Encounter: Payer: Self-pay | Admitting: Physical Therapy

## 2018-01-10 ENCOUNTER — Other Ambulatory Visit: Payer: Self-pay

## 2018-01-10 ENCOUNTER — Ambulatory Visit: Payer: Medicare Other | Attending: Internal Medicine | Admitting: Physical Therapy

## 2018-01-10 DIAGNOSIS — M5441 Lumbago with sciatica, right side: Secondary | ICD-10-CM | POA: Diagnosis present

## 2018-01-10 DIAGNOSIS — M5442 Lumbago with sciatica, left side: Secondary | ICD-10-CM | POA: Diagnosis present

## 2018-01-10 DIAGNOSIS — M545 Low back pain: Secondary | ICD-10-CM | POA: Insufficient documentation

## 2018-01-10 DIAGNOSIS — R262 Difficulty in walking, not elsewhere classified: Secondary | ICD-10-CM | POA: Diagnosis present

## 2018-01-10 DIAGNOSIS — M5416 Radiculopathy, lumbar region: Secondary | ICD-10-CM | POA: Insufficient documentation

## 2018-01-10 DIAGNOSIS — G8929 Other chronic pain: Secondary | ICD-10-CM | POA: Insufficient documentation

## 2018-01-10 DIAGNOSIS — M6281 Muscle weakness (generalized): Secondary | ICD-10-CM | POA: Diagnosis present
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
## 2018-01-14 ENCOUNTER — Ambulatory Visit: Payer: Medicare Other | Admitting: Physical Therapy

## 2018-01-14 ENCOUNTER — Encounter: Payer: Self-pay | Admitting: Physical Therapy

## 2018-01-14 DIAGNOSIS — M5442 Lumbago with sciatica, left side: Secondary | ICD-10-CM

## 2018-01-14 DIAGNOSIS — M5416 Radiculopathy, lumbar region: Secondary | ICD-10-CM

## 2018-01-14 DIAGNOSIS — R262 Difficulty in walking, not elsewhere classified: Secondary | ICD-10-CM

## 2018-01-14 DIAGNOSIS — M5441 Lumbago with sciatica, right side: Secondary | ICD-10-CM

## 2018-01-14 DIAGNOSIS — M6281 Muscle weakness (generalized): Secondary | ICD-10-CM

## 2018-01-14 DIAGNOSIS — M545 Low back pain: Secondary | ICD-10-CM | POA: Diagnosis not present

## 2018-01-14 NOTE — Therapy (Signed)
[place], [place], [address] Phone: [phone]   Fax:  [phone]  Physical Therapy Treatment  Patient Details  Name: [name] [name] MRN: [id number] Date of Birth: 08-25-1950 Referring Provider (PT): Sandi Mariscal, MD   Encounter Date: 01/14/2018  PT End of Session - 01/14/18 1150    Visit Number  2    Number of Visits  12    Date for PT Re-Evaluation  02/21/18    Authorization Type  [employer] MCR, add KX at 15 visits    PT Start Time  1147    PT Stop Time  1248    PT Time Calculation (min)  61 min       Past Medical History:  Diagnosis Date  . Allergy   . Anxiety   . Arthritis    lower back, hands  . Depression   . Fibromyalgia   . Fibromyalgia   . GERD (gastroesophageal reflux disease)   . Hx of bronchitis   . Hypertension   . Neuromuscular disorder (HCC)    fibromyalgia  . Osteoporosis   . Seizures ([place])    last one 11/24/16 states that they come and go  . Sleep apnea    Does not use CPAP  . Stroke [facility]) 2015   some aphasia  . Substance abuse [facility])     Past Surgical History:  Procedure Laterality Date  . BREAST LUMPECTOMY Right    benign  . CHOLECYSTECTOMY    . COLON SURGERY     Pt. reports polyps removed 2018  . TONSILLECTOMY    . WISDOM TOOTH EXTRACTION      There were no vitals filed for this visit.  Subjective Assessment - 01/14/18 1150    Currently in Pain?  Yes    Pain Score  8     Pain Location  Back    Pain Orientation  Left;Lower;Right    Pain Descriptors / Indicators  Burning;Sharp    Pain Type  Chronic pain    Aggravating Factors   walking     Pain Relieving Factors  meds, rest                        [facility] Adult PT Treatment/Exercise - 01/14/18 0001      Lumbar Exercises: Stretches   Single Knee to Chest Stretch  3 reps;30 seconds    Pelvic Tilt  10 reps    Piriformis Stretch  3 reps;30 seconds    Figure 4 Stretch  3 reps;30 seconds      Lumbar  Exercises: Aerobic   Nustep  L4 UE?LE x 5 minutes       Lumbar Exercises: Supine   Pelvic Tilt  15 reps    Clam  15 reps    Clam Limitations  Green Theraband    Other Supine Lumbar Exercises  x 15 pillow squeeze with core activation      Modalities   Modalities  Electrical Stimulation      Electrical Stimulation   Electrical Stimulation Location  Lumbar    Electrical Stimulation Action  IFC x 15 min    Electrical Stimulation Parameters  to tolerance    Electrical Stimulation Goals  Pain      Manual Therapy   Soft tissue mobilization  bilateral lumbar paraspinals, gluteals, hamstrings                PT Short Term Goals - 01/10/18 1202  PT SHORT TERM GOAL #1   Title  Independent with HEP    Baseline  instructed today    Time  3    Period  Weeks    Status  New      PT SHORT TERM GOAL #2   Title  Tolerate standing 5-10 minutes for chores at home, dishes with pain 7/10 or less    Baseline  difficulty, pain 9/10    Time  3    Period  Weeks    Status  New        PT Long Term Goals - 01/10/18 1203      PT LONG TERM GOAL #1   Title  Improve FOTO score to 59% or less impairment    Baseline  74% limited    Time  6    Period  Weeks    Status  New    Target Date  02/21/18      PT LONG TERM GOAL #2   Title  Perform community ambulation periods at least 20-30 min to go grocery shopping with pain 5/10 or less    Time  6    Period  Weeks    Status  New    Target Date  02/21/18      PT LONG TERM GOAL #3   Title  Increase bilat. LE strength grossly 1/2 MMT grade or greater to improve ability for transfers from low seats    Baseline  grossly 4/5-see flowsheet    Time  6    Period  Weeks    Status  New      PT LONG TERM GOAL #4   Title  Tolerate sitting at least 30 min for eating meals, transportation with pain 5/10 or less    Baseline  unable to tolerate without significant pain 9/10    Time  6    Period  Weeks    Status  New            Plan -  01/14/18 1200    Clinical Impression Statement  Pt reports she is a little better and has being performing her HEP. She requests massage today. Reviewed HEP and progressed stretcing, She demonstrates full flex/ER/IR hip ROM. Pt placed in prone with 2 pillow for lumbar soft tissue work as well as strumming to bilateral hamstrings. IFC trial to lumbar spine for pain relief. Afterward, she reports lumbar pain reduced however hamstring pain increased usually after first rising from non weightbearing positions.     Clinical Impairments Affecting Rehab Potential  chronic pain history, medical comorbidities    PT Next Visit Plan  assess benefit of soft tissue work for pain relief and repeat if good carryover. she has TENS at home- needs new pads; NUSTEP, review HEP as needed, check for innominate rotation and continue METs as needed, lumbopelvic stabilization and core + LE strengthening, mat-based flexion bias ROM, stretches posterolateral hip and hamstrings, modalities prn    PT Home Exercise Plan  pelvic tilts, SKTC, hip add. isometric, clamshells, glut sets    Consulted and Agree with Plan of Care  Patient       Patient will benefit from skilled therapeutic intervention in order to improve the following deficits and impairments:  Abnormal gait, Increased muscle spasms, Impaired flexibility, Difficulty walking, Decreased range of motion, Decreased endurance, Decreased activity tolerance, Decreased strength, Postural dysfunction, Pain  Visit Diagnosis: Muscle weakness (generalized)  Difficulty in walking, not elsewhere classified  Bilateral low back pain with bilateral  sciatica, unspecified chronicity  Radiculopathy, lumbar region     Problem List Patient Active Problem List   Diagnosis Date Noted  . Acute encephalopathy 12/13/2015  . Chronic pain 12/13/2015  . Substance abuse ([place]) 12/13/2015  . Prolonged Q-T interval on ECG 12/12/2015  . Accidental overdose 12/12/2015  . Fall 11/27/2015   . Generalized weakness 11/27/2015  . Hyponatremia 11/27/2015  . Frequent falls   . Altered mental status 08/19/2013  . Seizures ([place]) 08/19/2013  . Aphasia complicating stroke 41/29/0475  . Essential hypertension, benign 08/19/2013  . Hypokalemia 08/19/2013  . Marijuana use 08/19/2013  . Fibromyalgia 08/19/2013    Dorene Ar, PTA 01/14/2018, 1:03 PM  [facility] [address] [place], [place], [address] Phone: [phone]   Fax:  [phone]  Name: [name] [name] MRN: [id number] Date of Birth: 07-31-1950

## 2018-01-15 ENCOUNTER — Encounter: Payer: Self-pay | Admitting: Physical Therapy

## 2018-01-20 ENCOUNTER — Other Ambulatory Visit: Payer: Self-pay

## 2018-01-20 ENCOUNTER — Other Ambulatory Visit: Payer: Self-pay | Admitting: Family

## 2018-01-20 DIAGNOSIS — Z13820 Encounter for screening for osteoporosis: Secondary | ICD-10-CM

## 2018-01-20 DIAGNOSIS — Z780 Asymptomatic menopausal state: Secondary | ICD-10-CM

## 2018-01-21 ENCOUNTER — Telehealth: Payer: Self-pay | Admitting: Family Medicine

## 2018-01-21 ENCOUNTER — Ambulatory Visit: Payer: Medicare Other | Admitting: Physical Therapy

## 2018-01-21 NOTE — Telephone Encounter (Addendum)
Incorrect Provider on this note:   Left message regarding no show to appointment today and left number to call and reschedule appointment.

## 2018-01-23 ENCOUNTER — Telehealth: Payer: Self-pay | Admitting: Physical Therapy

## 2018-01-23 ENCOUNTER — Ambulatory Visit: Payer: Medicare Other | Admitting: Physical Therapy

## 2018-01-23 NOTE — Telephone Encounter (Signed)
No answer on home phone however able to reach patient on her cell phone regarding 2 no-show appointments this week. She reports she forgot. Reminded her of next appointment and she plans to attend. Reminded her of attendance policy.

## 2018-01-27 ENCOUNTER — Ambulatory Visit: Payer: Medicare Other | Admitting: Physical Therapy

## 2018-01-29 ENCOUNTER — Ambulatory Visit: Payer: Medicare Other | Admitting: Physical Therapy

## 2018-01-29 ENCOUNTER — Encounter: Payer: Self-pay | Admitting: Physical Therapy

## 2018-01-29 DIAGNOSIS — M6281 Muscle weakness (generalized): Secondary | ICD-10-CM

## 2018-01-29 DIAGNOSIS — M545 Low back pain, unspecified: Secondary | ICD-10-CM

## 2018-01-29 DIAGNOSIS — R262 Difficulty in walking, not elsewhere classified: Secondary | ICD-10-CM

## 2018-01-29 DIAGNOSIS — M5416 Radiculopathy, lumbar region: Secondary | ICD-10-CM

## 2018-01-29 DIAGNOSIS — M5442 Lumbago with sciatica, left side: Secondary | ICD-10-CM

## 2018-01-29 DIAGNOSIS — M5441 Lumbago with sciatica, right side: Secondary | ICD-10-CM

## 2018-01-29 DIAGNOSIS — G8929 Other chronic pain: Secondary | ICD-10-CM

## 2018-01-29 NOTE — Therapy (Signed)
[place], [place], [address] Phone: [phone]   Fax:  [phone]  Physical Therapy Treatment  Patient Details  Name: [name] [name] MRN: [id number] Date of Birth: February 23, 1950 Referring Provider (PT): Sandi Mariscal, MD   Encounter Date: 01/29/2018  PT End of Session - 01/29/18 1213    Visit Number  3    Number of Visits  12    Date for PT Re-Evaluation  02/21/18    Authorization Type  [employer] MCR, add KX at 15 visits    PT Start Time  1211    PT Stop Time  1310    PT Time Calculation (min)  59 min       Past Medical History:  Diagnosis Date  . Allergy   . Anxiety   . Arthritis    lower back, hands  . Depression   . Fibromyalgia   . Fibromyalgia   . GERD (gastroesophageal reflux disease)   . Hx of bronchitis   . Hypertension   . Neuromuscular disorder (HCC)    fibromyalgia  . Osteoporosis   . Seizures ([place])    last one 11/24/16 states that they come and go  . Sleep apnea    Does not use CPAP  . Stroke [facility]) 2015   some aphasia  . Substance abuse [facility])     Past Surgical History:  Procedure Laterality Date  . BREAST LUMPECTOMY Right    benign  . CHOLECYSTECTOMY    . COLON SURGERY     Pt. reports polyps removed 2018  . TONSILLECTOMY    . WISDOM TOOTH EXTRACTION      There were no vitals filed for this visit.  Subjective Assessment - 01/29/18 1214    Subjective  I am late. I am having trouble with transportation.     Pertinent History  history chronic LBP, fibromyalgia, comorbidities with history CVA, history seizures, depression, anxiety, stress with mechanism of onset, substance abuse history    Currently in Pain?  Yes    Pain Score  8     Pain Location  Back    Pain Orientation  Right;Left;Mid;Lower;Upper    Pain Descriptors / Indicators  Aching    Aggravating Factors   walking, activity, constant     Pain Relieving Factors  med, rest                        [facility]  Adult PT Treatment/Exercise - 01/29/18 0001      Lumbar Exercises: Aerobic   Nustep  L4 UE?LE x 5 minutes       Lumbar Exercises: Machines for Strengthening   Other Lumbar Machine Exercise  Seated row 15#low, 10# mid       Lumbar Exercises: Supine   Clam  15 reps    Clam Limitations  Green Theraband    Bent Knee Raise  10 reps    Bridge with clamshell  10 reps    Other Supine Lumbar Exercises  supine horizontal abduction and ER , red       Lumbar Exercises: Sidelying   Clam  10 reps    Clam Limitations  cues to stay on task       Electrical Stimulation   Electrical Stimulation Location  thoracic and Lumbar    Electrical Stimulation Action  IFC x 15 min    Electrical Stimulation Parameters  20 ma    Electrical Stimulation Goals  Pain  PT Short Term Goals - 01/10/18 1202      PT SHORT TERM GOAL #1   Title  Independent with HEP    Baseline  instructed today    Time  3    Period  Weeks    Status  New      PT SHORT TERM GOAL #2   Title  Tolerate standing 5-10 minutes for chores at home, dishes with pain 7/10 or less    Baseline  difficulty, pain 9/10    Time  3    Period  Weeks    Status  New        PT Long Term Goals - 01/10/18 1203      PT LONG TERM GOAL #1   Title  Improve FOTO score to 59% or less impairment    Baseline  74% limited    Time  6    Period  Weeks    Status  New    Target Date  02/21/18      PT LONG TERM GOAL #2   Title  Perform community ambulation periods at least 20-30 min to go grocery shopping with pain 5/10 or less    Time  6    Period  Weeks    Status  New    Target Date  02/21/18      PT LONG TERM GOAL #3   Title  Increase bilat. LE strength grossly 1/2 MMT grade or greater to improve ability for transfers from low seats    Baseline  grossly 4/5-see flowsheet    Time  6    Period  Weeks    Status  New      PT LONG TERM GOAL #4   Title  Tolerate sitting at least 30 min for eating meals, transportation with  pain 5/10 or less    Baseline  unable to tolerate without significant pain 9/10    Time  6    Period  Weeks    Status  New            Plan - 01/29/18 1216    Clinical Impression Statement  Pt reports HEP gives her some relief. She would like to return to the gym and is looking for a partner to go with her. Focused LE strength and core stability to promote tolerance to closed chain activity. Began seated row machine to begin incorporating gym mcahines. In standing she requires constant cues for technique and CGA. On mat she requires constant cues to stay alert and complete assisgned reps.     PT Next Visit Plan  she has TENS at home- needs new pads; NUSTEP, review HEP as needed, check for innominate rotation and continue METs as needed, lumbopelvic stabilization and core + LE strengthening, mat-based flexion bias ROM, stretches posterolateral hip and hamstrings, modalities prn    PT Home Exercise Plan  pelvic tilts, SKTC, hip add. isometric, clamshells, glut sets    Consulted and Agree with Plan of Care  Patient       Patient will benefit from skilled therapeutic intervention in order to improve the following deficits and impairments:  Abnormal gait, Increased muscle spasms, Impaired flexibility, Difficulty walking, Decreased range of motion, Decreased endurance, Decreased activity tolerance, Decreased strength, Postural dysfunction, Pain  Visit Diagnosis: Muscle weakness (generalized)  Difficulty in walking, not elsewhere classified  Radiculopathy, lumbar region  Chronic midline low back pain without sciatica  Bilateral low back pain with bilateral sciatica, unspecified chronicity  Problem List Patient Active Problem List   Diagnosis Date Noted  . Acute encephalopathy 12/13/2015  . Chronic pain 12/13/2015  . Substance abuse ([place]) 12/13/2015  . Prolonged Q-T interval on ECG 12/12/2015  . Accidental overdose 12/12/2015  . Fall 11/27/2015  . Generalized weakness  11/27/2015  . Hyponatremia 11/27/2015  . Frequent falls   . Altered mental status 08/19/2013  . Seizures ([place]) 08/19/2013  . Aphasia complicating stroke 65/79/0383  . Essential hypertension, benign 08/19/2013  . Hypokalemia 08/19/2013  . Marijuana use 08/19/2013  . Fibromyalgia 08/19/2013    Dorene Ar, PTA 01/29/2018, 1:12 PM  [facility] [address] [place], [place], [address] Phone: [phone]   Fax:  [phone]  Name: [name] [name] MRN: [id number] Date of Birth: February 10, 1950

## 2018-02-03 ENCOUNTER — Ambulatory Visit: Payer: Medicare Other | Admitting: Physical Therapy

## 2018-02-05 ENCOUNTER — Encounter: Payer: Self-pay | Admitting: Physical Therapy

## 2018-02-07 ENCOUNTER — Ambulatory Visit: Payer: Medicare Other | Admitting: Physical Therapy

## 2018-02-17 ENCOUNTER — Ambulatory Visit: Payer: Medicare Other | Attending: Internal Medicine | Admitting: Physical Therapy

## 2018-02-17 DIAGNOSIS — M5442 Lumbago with sciatica, left side: Secondary | ICD-10-CM | POA: Diagnosis present

## 2018-02-17 DIAGNOSIS — R262 Difficulty in walking, not elsewhere classified: Secondary | ICD-10-CM | POA: Diagnosis present

## 2018-02-17 DIAGNOSIS — M6281 Muscle weakness (generalized): Secondary | ICD-10-CM | POA: Diagnosis present

## 2018-02-17 DIAGNOSIS — M545 Low back pain: Secondary | ICD-10-CM | POA: Diagnosis present

## 2018-02-17 DIAGNOSIS — M5441 Lumbago with sciatica, right side: Secondary | ICD-10-CM | POA: Diagnosis present

## 2018-02-17 DIAGNOSIS — M5416 Radiculopathy, lumbar region: Secondary | ICD-10-CM

## 2018-02-17 DIAGNOSIS — G8929 Other chronic pain: Secondary | ICD-10-CM | POA: Diagnosis present

## 2018-02-17 NOTE — Therapy (Signed)
[place], [place], [address] Phone: [phone]   Fax:  [phone]  Physical Therapy Treatment  Patient Details  Name: [name] [name] MRN: [id number] Date of Birth: 1950-03-19 Referring Provider (PT): Sandi Mariscal, MD   Encounter Date: 02/17/2018  PT End of Session - 02/17/18 0930    Visit Number  4    Number of Visits  12    Date for PT Re-Evaluation  02/21/18    Authorization Type  [employer] MCR, add KX at 17 visits    PT Start Time  0845    PT Stop Time  0935   last 10 min on heat   PT Time Calculation (min)  50 min    Activity Tolerance  Patient tolerated treatment well;Patient limited by lethargy    Behavior During Therapy  [facility] for tasks assessed/performed       Past Medical History:  Diagnosis Date  . Allergy   . Anxiety   . Arthritis    lower back, hands  . Depression   . Fibromyalgia   . Fibromyalgia   . GERD (gastroesophageal reflux disease)   . Hx of bronchitis   . Hypertension   . Neuromuscular disorder (HCC)    fibromyalgia  . Osteoporosis   . Seizures ([place])    last one 11/24/16 states that they come and go  . Sleep apnea    Does not use CPAP  . Stroke [facility]) 2015   some aphasia  . Substance abuse [facility])     Past Surgical History:  Procedure Laterality Date  . BREAST LUMPECTOMY Right    benign  . CHOLECYSTECTOMY    . COLON SURGERY     Pt. reports polyps removed 2018  . TONSILLECTOMY    . WISDOM TOOTH EXTRACTION      There were no vitals filed for this visit.  Subjective Assessment - 02/17/18 0904    Subjective  Pt relays she had a bad weekend because of her back pain, she relays 9/10 pain, she was asked if anything helps relieve the pain and she did not answer quesiton despite being asked 3 times. She then relays she recieved a shot from the pharmacy this weekend for shingles and now she has this on her upper back    Pertinent History  history chronic LBP, fibromyalgia,  comorbidities with history CVA, history seizures, depression, anxiety, stress with mechanism of onset, substance abuse history    Currently in Pain?  Yes    Pain Score  9     Pain Location  Back                       [facility] Adult PT Treatment/Exercise - 02/17/18 0001      Lumbar Exercises: Stretches   Single Knee to Chest Stretch  Right;Left;2 reps;30 seconds    Double Knee to Chest Stretch Limitations  10 sec X 10 with feet on [place], needed manual facilitation to stay on task    Other Lumbar Stretch Exercise  sitting lumbar flexion stretch with [place] roll out 10 sec X 5 fwd, and each side. Needed manual facilitation      Lumbar Exercises: Aerobic   Nustep  L2 UE/LE X 5 min, she reported L4 was too hard      Lumbar Exercises: Machines for Strengthening   Other Lumbar Machine Exercise  Seated row 15#low, 10# mid       Lumbar Exercises: Seated   Other Seated  Lumbar Exercises  clam green X 20      Lumbar Exercises: Supine   Clam  5 reps    Clam Limitations  Green, only completed 5 then stopped as if she fell asleep so was prompted to perform in sitting      Moist Heat Therapy   Number Minutes Moist Heat  10 Minutes    Moist Heat Location  Lumbar Spine   did not put on thoracic as sings of shingles there     Manual Therapy   Soft tissue mobilization  STM to bilat lumbar P.S.               PT Short Term Goals - 01/10/18 1202      PT SHORT TERM GOAL #1   Title  Independent with HEP    Baseline  instructed today    Time  3    Period  Weeks    Status  New      PT SHORT TERM GOAL #2   Title  Tolerate standing 5-10 minutes for chores at home, dishes with pain 7/10 or less    Baseline  difficulty, pain 9/10    Time  3    Period  Weeks    Status  New        PT Long Term Goals - 01/10/18 1203      PT LONG TERM GOAL #1   Title  Improve FOTO score to 59% or less impairment    Baseline  74% limited    Time  6    Period  Weeks    Status  New     Target Date  02/21/18      PT LONG TERM GOAL #2   Title  Perform community ambulation periods at least 20-30 min to go grocery shopping with pain 5/10 or less    Time  6    Period  Weeks    Status  New    Target Date  02/21/18      PT LONG TERM GOAL #3   Title  Increase bilat. LE strength grossly 1/2 MMT grade or greater to improve ability for transfers from low seats    Baseline  grossly 4/5-see flowsheet    Time  6    Period  Weeks    Status  New      PT LONG TERM GOAL #4   Title  Tolerate sitting at least 30 min for eating meals, transportation with pain 5/10 or less    Baseline  unable to tolerate without significant pain 9/10    Time  6    Period  Weeks    Status  New            Plan - 02/17/18 0931    Clinical Impression Statement  Pt limited by lethargy today and was difficult to stay on task requiring max encouragment and manual facilitation to stay on task. She would not state if anything helps with the pain but did request STM. STM and heat applied to lumbar only to decrease pain and tightness and not applied to thoracic as she had signs of shingles in upper thoraic.      Rehab Potential  Fair    Clinical Impairments Affecting Rehab Potential  chronic pain history, medical comorbidities    PT Frequency  2x / week    PT Duration  6 weeks    PT Treatment/Interventions  ADLs/Self Care Home Management;Cryotherapy;Patient/family education;Therapeutic activities;Therapeutic exercise;Balance training;Electrical Stimulation;Moist Heat;Ultrasound;Functional mobility  training;Neuromuscular re-education;Manual techniques;Taping;Dry needling    PT Next Visit Plan  she has TENS at home- needs new pads; NUSTEP, review HEP as needed, check for innominate rotation and continue METs as needed, lumbopelvic stabilization and core + LE strengthening, mat-based flexion bias ROM, stretches posterolateral hip and hamstrings, modalities prn    PT Home Exercise Plan  pelvic tilts, SKTC, hip  add. isometric, clamshells, glut sets    Consulted and Agree with Plan of Care  Patient       Patient will benefit from skilled therapeutic intervention in order to improve the following deficits and impairments:  Abnormal gait, Increased muscle spasms, Impaired flexibility, Difficulty walking, Decreased range of motion, Decreased endurance, Decreased activity tolerance, Decreased strength, Postural dysfunction, Pain  Visit Diagnosis: Muscle weakness (generalized)  Difficulty in walking, not elsewhere classified  Radiculopathy, lumbar region     Problem List Patient Active Problem List   Diagnosis Date Noted  . Acute encephalopathy 12/13/2015  . Chronic pain 12/13/2015  . Substance abuse ([place]) 12/13/2015  . Prolonged Q-T interval on ECG 12/12/2015  . Accidental overdose 12/12/2015  . Fall 11/27/2015  . Generalized weakness 11/27/2015  . Hyponatremia 11/27/2015  . Frequent falls   . Altered mental status 08/19/2013  . Seizures ([place]) 08/19/2013  . Aphasia complicating stroke 76/16/0737  . Essential hypertension, benign 08/19/2013  . Hypokalemia 08/19/2013  . Marijuana use 08/19/2013  . Fibromyalgia 08/19/2013    Silvestre Mesi 02/17/2018, 9:42 AM  [facility] [address] [place], [place], [address] Phone: [phone]   Fax:  [phone]  Name: [name] [name] MRN: [id number] Date of Birth: 1950/09/26

## 2018-02-19 ENCOUNTER — Ambulatory Visit: Payer: Medicare Other | Admitting: Physical Therapy

## 2018-02-19 ENCOUNTER — Encounter: Payer: Self-pay | Admitting: Physical Therapy

## 2018-02-19 DIAGNOSIS — M545 Low back pain, unspecified: Secondary | ICD-10-CM

## 2018-02-19 DIAGNOSIS — M5441 Lumbago with sciatica, right side: Secondary | ICD-10-CM

## 2018-02-19 DIAGNOSIS — M6281 Muscle weakness (generalized): Secondary | ICD-10-CM | POA: Diagnosis not present

## 2018-02-19 DIAGNOSIS — M5416 Radiculopathy, lumbar region: Secondary | ICD-10-CM

## 2018-02-19 DIAGNOSIS — M5442 Lumbago with sciatica, left side: Secondary | ICD-10-CM

## 2018-02-19 DIAGNOSIS — R262 Difficulty in walking, not elsewhere classified: Secondary | ICD-10-CM

## 2018-02-19 DIAGNOSIS — G8929 Other chronic pain: Secondary | ICD-10-CM

## 2018-02-19 NOTE — Therapy (Signed)
[place], [place], [address] Phone: [phone]   Fax:  [phone]  Physical Therapy Treatment  Patient Details  Name: [name] [name] MRN: [id number] Date of Birth: 1950-11-22 Referring Provider (PT): Sandi Mariscal, MD   Encounter Date: 02/19/2018  PT End of Session - 02/19/18 1203    Visit Number  5    Number of Visits  12    Date for PT Re-Evaluation  02/21/18    Authorization Type  [employer] MCR, add KX at 15 visits    PT Start Time  1200    PT Stop Time  1259    PT Time Calculation (min)  59 min       Past Medical History:  Diagnosis Date  . Allergy   . Anxiety   . Arthritis    lower back, hands  . Depression   . Fibromyalgia   . Fibromyalgia   . GERD (gastroesophageal reflux disease)   . Hx of bronchitis   . Hypertension   . Neuromuscular disorder (HCC)    fibromyalgia  . Osteoporosis   . Seizures ([place])    last one 11/24/16 states that they come and go  . Sleep apnea    Does not use CPAP  . Stroke [facility]) 2015   some aphasia  . Substance abuse [facility])     Past Surgical History:  Procedure Laterality Date  . BREAST LUMPECTOMY Right    benign  . CHOLECYSTECTOMY    . COLON SURGERY     Pt. reports polyps removed 2018  . TONSILLECTOMY    . WISDOM TOOTH EXTRACTION      There were no vitals filed for this visit.                    [facility] Adult PT Treatment/Exercise - 02/19/18 0001      Lumbar Exercises: Aerobic   Nustep  L5 UE/LE x 5 minutes       Lumbar Exercises: Standing   Heel Raises  10 reps    Other Standing Lumbar Exercises  3 way hip at counter, cues to complete reps       Lumbar Exercises: Seated   Other Seated Lumbar Exercises  clam red X 20    Other Seated Lumbar Exercises  ball squeeze, needs reminders to complete reps.       Lumbar Exercises: Supine   Bridge  10 reps    Straight Leg Raise  10 reps      Lumbar Exercises: Sidelying   Clam  15 reps    Clam Limitations  stops during reps and needs verbal cues to continue                PT Short Term Goals - 02/19/18 1252      PT SHORT TERM GOAL #1   Title  Independent with HEP    Baseline  reports she has started to do her HEP at home    Time  3    Period  Weeks    Status  On-going      PT SHORT TERM GOAL #2   Title  Tolerate standing 5-10 minutes for chores at home, dishes with pain 7/10 or less    Baseline  still limited by high pain levels.     Time  3    Period  Weeks    Status  On-going        PT Long Term Goals - 01/10/18 1203  PT LONG TERM GOAL #1   Title  Improve FOTO score to 59% or less impairment    Baseline  74% limited    Time  6    Period  Weeks    Status  New    [place] Date  02/21/18      PT LONG TERM GOAL #2   Title  Perform community ambulation periods at least 20-30 min to go grocery shopping with pain 5/10 or less    Time  6    Period  Weeks    Status  New    Target Date  02/21/18      PT LONG TERM GOAL #3   Title  Increase bilat. LE strength grossly 1/2 MMT grade or greater to improve ability for transfers from low seats    Baseline  grossly 4/5-see flowsheet    Time  6    Period  Weeks    Status  New      PT LONG TERM GOAL #4   Title  Tolerate sitting at least 30 min for eating meals, transportation with pain 5/10 or less    Baseline  unable to tolerate without significant pain 9/10    Time  6    Period  Weeks    Status  New            Plan - 02/19/18 1251    Clinical Impression Statement  Pt arrives reporting pain has been bad. She does report she has started doing her HEP at home. Pt limited by lethargy today and requires constant cues to stay on task with standing, seated, and supine therex. HMP to lumbar spine in prone at end of session.     PT Next Visit Plan  discharge to HEP next visit     PT Home Exercise Plan  pelvic tilts, SKTC, hip add. isometric, clamshells, glut sets    Consulted and Agree with Plan of  Care  Patient      During this treatment session, the therapist was present, participating in and directing the treatment.  Patient will benefit from skilled therapeutic intervention in order to improve the following deficits and impairments:  Abnormal gait, Increased muscle spasms, Impaired flexibility, Difficulty walking, Decreased range of motion, Decreased endurance, Decreased activity tolerance, Decreased strength, Postural dysfunction, Pain  Visit Diagnosis: Muscle weakness (generalized)  Difficulty in walking, not elsewhere classified  Radiculopathy, lumbar region  Chronic midline low back pain without sciatica  Bilateral low back pain with bilateral sciatica, unspecified chronicity     Problem List Patient Active Problem List   Diagnosis Date Noted  . Acute encephalopathy 12/13/2015  . Chronic pain 12/13/2015  . Substance abuse ([place]) 12/13/2015  . Prolonged Q-T interval on ECG 12/12/2015  . Accidental overdose 12/12/2015  . Fall 11/27/2015  . Generalized weakness 11/27/2015  . Hyponatremia 11/27/2015  . Frequent falls   . Altered mental status 08/19/2013  . Seizures ([place]) 08/19/2013  . Aphasia complicating stroke 56/31/4970  . Essential hypertension, benign 08/19/2013  . Hypokalemia 08/19/2013  . Marijuana use 08/19/2013  . Fibromyalgia 08/19/2013    Dorene Ar, PTA 02/19/2018, 1:25 PM  [place], [place], [address] Phone: [phone]   Fax:  [phone]  Name: [name] [name] MRN: [id number] Date of Birth: 12-22-1950

## 2018-02-25 ENCOUNTER — Ambulatory Visit: Payer: Medicare Other | Admitting: Physical Therapy

## 2018-03-06 ENCOUNTER — Ambulatory Visit: Payer: Medicare Other | Admitting: Physical Therapy

## 2018-03-06 ENCOUNTER — Encounter: Payer: Self-pay | Admitting: Physical Therapy

## 2018-03-06 DIAGNOSIS — G8929 Other chronic pain: Secondary | ICD-10-CM

## 2018-03-06 DIAGNOSIS — R262 Difficulty in walking, not elsewhere classified: Secondary | ICD-10-CM

## 2018-03-06 DIAGNOSIS — M545 Low back pain: Secondary | ICD-10-CM

## 2018-03-06 DIAGNOSIS — M6281 Muscle weakness (generalized): Secondary | ICD-10-CM | POA: Diagnosis not present

## 2018-03-06 NOTE — Therapy (Signed)
[place], [place], [address] Phone: [phone]   Fax:  [phone]  Physical Therapy Treatment Progress Note Reporting Period 01/10/2018 to 03/06/2018  See note below for Objective Data and Assessment of Progress/Goals.       Patient Details  Name: [name] [name] MRN: [id number] Date of Birth: 1950/05/14 Referring Provider (PT): Sandi Mariscal, MD   Encounter Date: 03/06/2018  PT End of Session - 03/06/18 1552    Visit Number  6    Number of Visits  18    Date for PT Re-Evaluation  04/17/18    Authorization Type  pt. not has managed [employer]    PT Start Time  1340    PT Stop Time  1425    PT Time Calculation (min)  45 min    Activity Tolerance  Patient tolerated treatment well;Patient limited by lethargy    Behavior During Therapy  [facility] for tasks assessed/performed       Past Medical History:  Diagnosis Date  . Allergy   . Anxiety   . Arthritis    lower back, hands  . Depression   . Fibromyalgia   . Fibromyalgia   . GERD (gastroesophageal reflux disease)   . Hx of bronchitis   . Hypertension   . Neuromuscular disorder (HCC)    fibromyalgia  . Osteoporosis   . Seizures ([place])    last one 11/24/16 states that they come and go  . Sleep apnea    Does not use CPAP  . Stroke [facility]) 2015   some aphasia  . Substance abuse [facility])     Past Surgical History:  Procedure Laterality Date  . BREAST LUMPECTOMY Right    benign  . CHOLECYSTECTOMY    . COLON SURGERY     Pt. reports polyps removed 2018  . TONSILLECTOMY    . WISDOM TOOTH EXTRACTION      There were no vitals filed for this visit.  Subjective Assessment - 03/06/18 1344    Subjective  Pt. returns for 6th therapy visit since eval 01/10/18. Therapy attendance has been limited with transportation issues which today she reports are now resolved. She reports still has "good days and bad days" with her back, she continues with pain management. She  does report that therapy has been helpful for improving her ability for transfers/"getting up" and for positional tolerance.    Pertinent History  history chronic LBP, fibromyalgia, comorbidities with history CVA, history seizures, depression, anxiety, stress with mechanism of onset, substance abuse history    Limitations  Sitting;House hold activities;Standing;Walking    How long can you sit comfortably?  5-6 minutes    How long can you stand comfortably?  4-5 minutes    How long can you walk comfortably?  9 minutes    Diagnostic tests  no imaging current episode, past lumbar MRI and CT 2017 per reports showed degenerative changes, stenosis    Patient Stated Goals  Be able to return to exercising independently    Currently in Pain?  Yes    Pain Score  9     Pain Location  Back    Pain Orientation  Lower;Left    Pain Descriptors / Indicators  Aching    Pain Type  Chronic pain    Pain Onset  More than a month ago    Pain Frequency  Constant    Aggravating Factors   "trying to get up"    Pain Relieving Factors  stretching  Effect of Pain on Daily Activities  limits tolerance for mobility, prolonged positioning for standing and ambulation         [facility] PT Assessment - 03/06/18 0001      Observation/Other Assessments   Focus on Therapeutic Outcomes (FOTO)   68% limited      Strength   Right Hip Flexion  4/5    Right Hip External Rotation   4/5    Right Hip Internal Rotation  4/5    Left Hip Flexion  4/5    Left Hip External Rotation  4/5    Left Hip Internal Rotation  4/5    Right Knee Flexion  5/5    Right Knee Extension  4+/5    Left Knee Flexion  5/5    Left Knee Extension  4+/5    Right/Left Ankle  --   ankles grossly 4/5 bilat., PF not tested                  [facility] Adult PT Treatment/Exercise - 03/06/18 0001      Lumbar Exercises: Stretches   Single Knee to Chest Stretch  Right;Left;2 reps;20 seconds      Lumbar Exercises: Aerobic   Nustep  L5 UE/LE x 5  min      Lumbar Exercises: Supine   Pelvic Tilt  10 reps    Clam  10 reps    Clam Limitations  Green Theraband    Bent Knee Raise  10 reps    Bridge  10 reps    Bridge Limitations  legs on bolster             PT Education - 03/06/18 1359    Education Details  POC    Person(s) Educated  Patient    Methods  Explanation    Comprehension  Verbalized understanding       PT Short Term Goals - 03/06/18 1646      PT SHORT TERM GOAL #1   Title  Independent with HEP    Baseline  reports she has started to do her HEP at home    Time  3    Period  Weeks    Status  On-going      PT SHORT TERM GOAL #2   Title  Tolerate standing 5-10 minutes for chores at home, dishes with pain 7/10 or less    Baseline  still limited by high pain levels with pain 9/10 but now able to tolerate standing up to 5 min    Time  3    Period  Weeks    Status  On-going        PT Long Term Goals - 03/06/18 1648      PT LONG TERM GOAL #1   Title  Improve FOTO score to 59% or less impairment    Baseline  68% limited (74% at eval)    Time  6    Period  Weeks    Status  On-going    [place] Date  04/17/18      PT LONG TERM GOAL #2   Title  Perform community ambulation periods at least 20-30 min to go grocery shopping with pain 5/10 or less    Baseline  ongoing    Time  6    Period  Weeks    Status  On-going    [place] Date  04/17/18      PT LONG TERM GOAL #3   Title  Increase bilat. LE strength grossly  1/2 MMT grade or greater to improve ability for transfers from low seats    Baseline  see flowsheet, met for knees but not met for hips, ankles    Time  6    Period  Weeks    Status  Partially Met    Target Date  04/17/18      PT LONG TERM GOAL #4   Title  Tolerate sitting at least 30 min for eating meals, transportation with pain 5/10 or less    Baseline  self report 4-5 minutes pain 9/10    Time  6    Period  Weeks    Status  On-going            Plan - 03/06/18 1632    Clinical  Impression Statement  Pt. presents for 6th therapy visist since iniating plan of care 01/10/18. Limited attendance to date impacted by missed visits/transportation issues which pt. reports are resolved. Mild improvement in functional status per FOTO score today, mild gains for LE strength. Pt. continues with high pain level but reports functional gains for positional tolerance and ability for ADLs. Status impacted by chronic pain history and multiple medical comorbidities. Difficulty at times performing and progressing exercises in therapy sessions due to cognitive status with pt. at times having trouble completing sets and seeming to fall asleep/she reports stops at times due to pain. Pt. ultimately wishes to progres toward independent ability to exercise at gym. Given duration of symptoms and complicating factors as noted expect functional gains wiould be gradual. Given improvements to date plan extend therapy pending ability for pt. to attend and actively participate in therapy sessions.    History and Personal Factors relevant to plan of care:  chronic LBP history, anxiety/depression, fibromyalgia, medical comorbidities, substance abuse history    Clinical Presentation  Stable    Clinical Decision Making  Moderate    Rehab Potential  Fair    Clinical Impairments Affecting Rehab Potential  chronic pain history, medical comorbidities, difficulty maintaining concentration for exercise performance in session    PT Frequency  2x / week    PT Duration  6 weeks    PT Treatment/Interventions  ADLs/Self Care Home Management;Cryotherapy;Patient/family education;Therapeutic activities;Therapeutic exercise;Balance training;Electrical Stimulation;Moist Heat;Ultrasound;Functional mobility training;Neuromuscular re-education;Manual techniques;Taping;Dry needling    PT Next Visit Plan  Continue/progress exercise focus as tolerated with Lumbar/core strengthening, ROM, functional activity progresion    PT Home Exercise  Plan  pelvic tilts, SKTC, hip add. isometric, clamshells, glut sets    Consulted and Agree with Plan of Care  Patient       Patient will benefit from skilled therapeutic intervention in order to improve the following deficits and impairments:  Abnormal gait, Increased muscle spasms, Impaired flexibility, Difficulty walking, Decreased range of motion, Decreased endurance, Decreased activity tolerance, Decreased strength, Postural dysfunction, Pain  Visit Diagnosis: Muscle weakness (generalized)  Chronic midline low back pain without sciatica  Difficulty in walking, not elsewhere classified     Problem List Patient Active Problem List   Diagnosis Date Noted  . Acute encephalopathy 12/13/2015  . Chronic pain 12/13/2015  . Substance abuse ([place]) 12/13/2015  . Prolonged Q-T interval on ECG 12/12/2015  . Accidental overdose 12/12/2015  . Fall 11/27/2015  . Generalized weakness 11/27/2015  . Hyponatremia 11/27/2015  . Frequent falls   . Altered mental status 08/19/2013  . Seizures ([place]) 08/19/2013  . Aphasia complicating stroke 37/16/9678  . Essential hypertension, benign 08/19/2013  . Hypokalemia 08/19/2013  . Marijuana use 08/19/2013  .  Fibromyalgia 08/19/2013   [name], PT, DPT 03/06/18 4:57 PM  [facility] [address] [place], [place], [address] Phone: [phone]   Fax:  [phone]  Name: [name] [name] MRN: [id number] Date of Birth: [birth year]

## 2018-09-01 NOTE — Therapy (Signed)
[place], [place], [address] Phone: [phone]   Fax:  [phone]  Physical Therapy Treatment/Discharge  Patient Details  Name: [name] [name] MRN: [id number] Date of Birth: 1950/12/23 Referring Provider (PT): Sandi Mariscal, MD   Encounter Date: 03/06/2018    Past Medical History:  Diagnosis Date  . Allergy   . Anxiety   . Arthritis    lower back, hands  . Depression   . Fibromyalgia   . Fibromyalgia   . GERD (gastroesophageal reflux disease)   . Hx of bronchitis   . Hypertension   . Neuromuscular disorder (HCC)    fibromyalgia  . Osteoporosis   . Seizures ([place])    last one 11/24/16 states that they come and go  . Sleep apnea    Does not use CPAP  . Stroke [facility]) 2015   some aphasia  . Substance abuse [facility])     Past Surgical History:  Procedure Laterality Date  . BREAST LUMPECTOMY Right    benign  . CHOLECYSTECTOMY    . COLON SURGERY     Pt. reports polyps removed 2018  . TONSILLECTOMY    . WISDOM TOOTH EXTRACTION      There were no vitals filed for this visit.                              PT Short Term Goals - 03/06/18 1646      PT SHORT TERM GOAL #1   Title  Independent with HEP    Baseline  reports she has started to do her HEP at home    Time  3    Period  Weeks    Status  On-going      PT SHORT TERM GOAL #2   Title  Tolerate standing 5-10 minutes for chores at home, dishes with pain 7/10 or less    Baseline  still limited by high pain levels with pain 9/10 but now able to tolerate standing up to 5 min    Time  3    Period  Weeks    Status  On-going        PT Long Term Goals - 03/06/18 1648      PT LONG TERM GOAL #1   Title  Improve FOTO score to 59% or less impairment    Baseline  68% limited (74% at eval)    Time  6    Period  Weeks    Status  On-going    Target Date  04/17/18      PT LONG TERM GOAL #2   Title  Perform community  ambulation periods at least 20-30 min to go grocery shopping with pain 5/10 or less    Baseline  ongoing    Time  6    Period  Weeks    Status  On-going    Target Date  04/17/18      PT LONG TERM GOAL #3   Title  Increase bilat. LE strength grossly 1/2 MMT grade or greater to improve ability for transfers from low seats    Baseline  see flowsheet, met for knees but not met for hips, ankles    Time  6    Period  Weeks    Status  Partially Met    Target Date  04/17/18      PT LONG TERM GOAL #4   Title  Tolerate sitting at least  30 min for eating meals, transportation with pain 5/10 or less    Baseline  self report 4-5 minutes pain 9/10    Time  6    Period  Weeks    Status  On-going              Patient will benefit from skilled therapeutic intervention in order to improve the following deficits and impairments:  Abnormal gait, Increased muscle spasms, Impaired flexibility, Difficulty walking, Decreased range of motion, Decreased endurance, Decreased activity tolerance, Decreased strength, Postural dysfunction, Pain  Visit Diagnosis: Muscle weakness (generalized) - Plan: PT plan of care cert/re-cert  Chronic midline low back pain without sciatica - Plan: PT plan of care cert/re-cert  Difficulty in walking, not elsewhere classified - Plan: PT plan of care cert/re-cert     Problem List Patient Active Problem List   Diagnosis Date Noted  . Acute encephalopathy 12/13/2015  . Chronic pain 12/13/2015  . Substance abuse ([place]) 12/13/2015  . Prolonged Q-T interval on ECG 12/12/2015  . Accidental overdose 12/12/2015  . Fall 11/27/2015  . Generalized weakness 11/27/2015  . Hyponatremia 11/27/2015  . Frequent falls   . Altered mental status 08/19/2013  . Seizures ([place]) 08/19/2013  . Aphasia complicating stroke 34/74/2595  . Essential hypertension, benign 08/19/2013  . Hypokalemia 08/19/2013  . Marijuana use 08/19/2013  . Fibromyalgia 08/19/2013     PHYSICAL THERAPY  DISCHARGE SUMMARY  Visits from Start of Care: 6  Current functional level related to goals / functional outcomes: Status unknown: patient did not return after last visit 03/06/18.   Remaining deficits: Status unknown   Education / Equipment: NA  Plan:                                                    Patient goals were not met. Patient is being discharged due to not returning since the last visit.  ?????           [name] [name], PT, DPT 09/01/18 10:05 AM    [facility] [address] [place], [place], [address] Phone: [phone]   Fax:  [phone]  Name: [name] [name] MRN: [id number] Date of Birth: Dec 24, 1950
